# Patient Record
Sex: Male | Born: 1940 | Race: White | Hispanic: No | Marital: Married | Smoking: Current every day smoker
Health system: Southern US, Community
[De-identification: ages and names within clinical notes are randomized; demographics above are authoritative.]

## PROBLEM LIST (undated history)

## (undated) DIAGNOSIS — Z8781 Personal history of (healed) traumatic fracture: Secondary | ICD-10-CM

## (undated) DIAGNOSIS — M810 Age-related osteoporosis without current pathological fracture: Secondary | ICD-10-CM

## (undated) DIAGNOSIS — F329 Major depressive disorder, single episode, unspecified: Secondary | ICD-10-CM

## (undated) DIAGNOSIS — R1084 Generalized abdominal pain: Secondary | ICD-10-CM

## (undated) DIAGNOSIS — C3492 Malignant neoplasm of unspecified part of left bronchus or lung: Principal | ICD-10-CM

## (undated) DIAGNOSIS — E039 Hypothyroidism, unspecified: Secondary | ICD-10-CM

## (undated) DIAGNOSIS — R918 Other nonspecific abnormal finding of lung field: Secondary | ICD-10-CM

## (undated) DIAGNOSIS — K861 Other chronic pancreatitis: Secondary | ICD-10-CM

## (undated) DIAGNOSIS — M549 Dorsalgia, unspecified: Secondary | ICD-10-CM

## (undated) DIAGNOSIS — F1910 Other psychoactive substance abuse, uncomplicated: Secondary | ICD-10-CM

## (undated) DIAGNOSIS — F419 Anxiety disorder, unspecified: Secondary | ICD-10-CM

## (undated) DIAGNOSIS — F32A Depression, unspecified: Secondary | ICD-10-CM

## (undated) DIAGNOSIS — H9190 Unspecified hearing loss, unspecified ear: Secondary | ICD-10-CM

## (undated) DIAGNOSIS — G8929 Other chronic pain: Secondary | ICD-10-CM

## (undated) HISTORY — DX: Hypothyroidism, unspecified: E03.9

## (undated) HISTORY — PX: APPENDECTOMY: SHX54

## (undated) HISTORY — DX: Major depressive disorder, single episode, unspecified: F32.9

## (undated) HISTORY — DX: Anxiety disorder, unspecified: F41.9

## (undated) HISTORY — DX: Other nonspecific abnormal finding of lung field: R91.8

## (undated) HISTORY — DX: Depression, unspecified: F32.A

## (undated) HISTORY — DX: Other chronic pain: G89.29

## (undated) HISTORY — DX: Other psychoactive substance abuse, uncomplicated: F19.10

## (undated) HISTORY — DX: Age-related osteoporosis without current pathological fracture: M81.0

## (undated) HISTORY — DX: Dorsalgia, unspecified: M54.9

## (undated) HISTORY — DX: Generalized abdominal pain: R10.84

## (undated) HISTORY — DX: Other chronic pancreatitis: K86.1

## (undated) HISTORY — DX: Malignant neoplasm of unspecified part of left bronchus or lung: C34.92

## (undated) HISTORY — PX: OTHER SURGICAL HISTORY: SHX169

## (undated) HISTORY — DX: Unspecified hearing loss, unspecified ear: H91.90

## (undated) HISTORY — DX: Personal history of (healed) traumatic fracture: Z87.81

---

## 1991-03-26 HISTORY — PX: MESENTERIC ARTERY BYPASS: SHX5968

## 1991-03-26 HISTORY — PX: BOWEL RESECTION: SHX1257

## 2007-10-02 ENCOUNTER — Encounter: Admission: RE | Admit: 2007-10-02 | Discharge: 2007-10-02 | Payer: Self-pay | Admitting: Family Medicine

## 2009-03-27 ENCOUNTER — Ambulatory Visit (HOSPITAL_COMMUNITY): Admission: RE | Admit: 2009-03-27 | Discharge: 2009-03-27 | Payer: Self-pay | Admitting: Family Medicine

## 2010-04-09 ENCOUNTER — Encounter
Admission: RE | Admit: 2010-04-09 | Discharge: 2010-04-09 | Payer: Self-pay | Source: Home / Self Care | Attending: Family Medicine | Admitting: Family Medicine

## 2010-04-18 ENCOUNTER — Ambulatory Visit (HOSPITAL_COMMUNITY)
Admission: RE | Admit: 2010-04-18 | Discharge: 2010-04-18 | Payer: Self-pay | Source: Home / Self Care | Attending: Family Medicine | Admitting: Family Medicine

## 2011-05-24 HISTORY — PX: EYE SURGERY: SHX253

## 2015-12-29 ENCOUNTER — Ambulatory Visit
Admission: RE | Admit: 2015-12-29 | Discharge: 2015-12-29 | Disposition: A | Discharge status: Home / Self Care | Payer: Medicare Other | Source: Ambulatory Visit | Attending: Family Medicine | Admitting: Family Medicine

## 2015-12-29 ENCOUNTER — Other Ambulatory Visit: Payer: Self-pay | Admitting: Family Medicine

## 2015-12-29 DIAGNOSIS — M25532 Pain in left wrist: Secondary | ICD-10-CM

## 2015-12-29 DIAGNOSIS — M25512 Pain in left shoulder: Secondary | ICD-10-CM

## 2015-12-29 DIAGNOSIS — M79602 Pain in left arm: Secondary | ICD-10-CM

## 2015-12-29 DIAGNOSIS — R0989 Other specified symptoms and signs involving the circulatory and respiratory systems: Secondary | ICD-10-CM

## 2015-12-29 DIAGNOSIS — R0781 Pleurodynia: Secondary | ICD-10-CM

## 2016-01-02 ENCOUNTER — Other Ambulatory Visit: Payer: Self-pay | Admitting: Family Medicine

## 2016-01-02 DIAGNOSIS — R9389 Abnormal findings on diagnostic imaging of other specified body structures: Secondary | ICD-10-CM

## 2016-01-05 ENCOUNTER — Other Ambulatory Visit: Payer: Medicare Other

## 2016-01-09 ENCOUNTER — Other Ambulatory Visit: Payer: Medicare Other

## 2016-01-11 ENCOUNTER — Ambulatory Visit
Admission: RE | Admit: 2016-01-11 | Discharge: 2016-01-11 | Disposition: A | Discharge status: Home / Self Care | Payer: Medicare Other | Source: Ambulatory Visit | Attending: Family Medicine | Admitting: Family Medicine

## 2016-01-11 DIAGNOSIS — R9389 Abnormal findings on diagnostic imaging of other specified body structures: Secondary | ICD-10-CM

## 2016-01-11 MED ORDER — IOPAMIDOL (ISOVUE-300) INJECTION 61%
75.0000 mL | Freq: Once | INTRAVENOUS | Status: AC | PRN
  Administered 2016-01-11: 75 mL via INTRAVENOUS

## 2016-01-15 ENCOUNTER — Other Ambulatory Visit (HOSPITAL_COMMUNITY): Payer: Self-pay | Admitting: Family Medicine

## 2016-01-15 DIAGNOSIS — R918 Other nonspecific abnormal finding of lung field: Secondary | ICD-10-CM

## 2016-01-23 ENCOUNTER — Encounter (HOSPITAL_COMMUNITY)
Admission: RE | Admit: 2016-01-23 | Discharge: 2016-01-23 | Disposition: A | Discharge status: Home / Self Care | Payer: Medicare Other | Source: Ambulatory Visit | Attending: Family Medicine | Admitting: Family Medicine

## 2016-01-23 DIAGNOSIS — R918 Other nonspecific abnormal finding of lung field: Secondary | ICD-10-CM | POA: Insufficient documentation

## 2016-01-23 LAB — GLUCOSE, CAPILLARY: Glucose-Capillary: 88 mg/dL (ref 65–99)

## 2016-01-23 MED ORDER — FLUDEOXYGLUCOSE F - 18 (FDG) INJECTION
6.3000 | Freq: Once | INTRAVENOUS | Status: AC | PRN
  Administered 2016-01-23: 6.3 via INTRAVENOUS

## 2016-01-30 ENCOUNTER — Encounter: Payer: Self-pay | Admitting: *Deleted

## 2016-01-30 NOTE — Progress Notes (Signed)
Oncology Nurse Navigator Documentation  Oncology Nurse Navigator Flowsheets 01/30/2016  Referral date to RadOnc/MedOnc 01/30/2016  Treatment Phase Pre-Tx/Tx Discussion/I received referral today on Miguel Orr.  Patient needs tissue DX and to see thoracic surgery.  I notified [facility] office to schedule. I then called the referring office and notified them of next steps for patient.  I spoke with [facility] and she was thankful for the call.   Barriers/Navigation Needs Coordination of Care  Interventions Coordination of Care  Coordination of Care Appts  Acuity Level 2  Acuity Level 2 Assistance expediting appointments  Time Spent with Patient 30

## 2016-02-05 ENCOUNTER — Encounter: Payer: Self-pay | Admitting: *Deleted

## 2016-02-05 DIAGNOSIS — R918 Other nonspecific abnormal finding of lung field: Secondary | ICD-10-CM | POA: Insufficient documentation

## 2016-02-05 DIAGNOSIS — F32A Depression, unspecified: Secondary | ICD-10-CM | POA: Insufficient documentation

## 2016-02-05 DIAGNOSIS — M549 Dorsalgia, unspecified: Secondary | ICD-10-CM

## 2016-02-05 DIAGNOSIS — F329 Major depressive disorder, single episode, unspecified: Secondary | ICD-10-CM | POA: Insufficient documentation

## 2016-02-05 DIAGNOSIS — F419 Anxiety disorder, unspecified: Secondary | ICD-10-CM | POA: Insufficient documentation

## 2016-02-05 DIAGNOSIS — F3289 Other specified depressive episodes: Secondary | ICD-10-CM

## 2016-02-05 DIAGNOSIS — R1084 Generalized abdominal pain: Secondary | ICD-10-CM

## 2016-02-05 DIAGNOSIS — Z8781 Personal history of (healed) traumatic fracture: Secondary | ICD-10-CM | POA: Insufficient documentation

## 2016-02-05 DIAGNOSIS — M810 Age-related osteoporosis without current pathological fracture: Secondary | ICD-10-CM | POA: Insufficient documentation

## 2016-02-05 DIAGNOSIS — H9190 Unspecified hearing loss, unspecified ear: Secondary | ICD-10-CM | POA: Insufficient documentation

## 2016-02-05 DIAGNOSIS — G8929 Other chronic pain: Secondary | ICD-10-CM | POA: Insufficient documentation

## 2016-02-05 DIAGNOSIS — F1910 Other psychoactive substance abuse, uncomplicated: Secondary | ICD-10-CM

## 2016-02-05 DIAGNOSIS — E079 Disorder of thyroid, unspecified: Secondary | ICD-10-CM | POA: Insufficient documentation

## 2016-02-05 DIAGNOSIS — H918X3 Other specified hearing loss, bilateral: Secondary | ICD-10-CM

## 2016-02-05 DIAGNOSIS — M8080XS Other osteoporosis with current pathological fracture, unspecified site, sequela: Secondary | ICD-10-CM

## 2016-02-05 DIAGNOSIS — K861 Other chronic pancreatitis: Secondary | ICD-10-CM

## 2016-02-06 ENCOUNTER — Encounter: Payer: Self-pay | Admitting: Cardiothoracic Surgery

## 2016-02-06 ENCOUNTER — Institutional Professional Consult (permissible substitution) (INDEPENDENT_AMBULATORY_CARE_PROVIDER_SITE_OTHER): Payer: Medicare Other | Admitting: Cardiothoracic Surgery

## 2016-02-06 VITALS — BP 123/72 | HR 71 | Resp 16 | Ht 66.0 in | Wt 120.0 lb

## 2016-02-06 DIAGNOSIS — R918 Other nonspecific abnormal finding of lung field: Secondary | ICD-10-CM

## 2016-02-06 NOTE — Patient Instructions (Signed)
Lung Cancer Lung cancer occurs when abnormal cells in the lung grow out of control and form a mass (tumor). There are several types of lung cancer. The two most common types are:  Non-small cell. In this type of lung cancer, abnormal cells are larger and grow more slowly than those of small cell lung cancer.  Small cell. In this type of lung cancer, abnormal cells are smaller than those of non-small cell lung cancer. Small cell lung cancer gets worse faster than non-small cell lung cancer.  What are the causes? The leading cause of lung cancer is smoking tobacco. The second leading cause is radon exposure. What increases the risk?  Smoking tobacco.  Exposure to secondhand tobacco smoke.  Exposure to radon gas.  Exposure to asbestos.  Exposure to arsenic in drinking water.  Air pollution.  Family or personal history of lung cancer.  Lung radiation therapy.  Being older than 65 years. What are the signs or symptoms? In the early stages, symptoms may not be present. As the cancer progresses, symptoms may include:  A lasting cough, possibly with blood.  Fatigue.  Unexplained weight loss.  Shortness of breath.  Wheezing.  Chest pain.  Loss of appetite.  Symptoms of advanced lung cancer include:  Hoarseness.  Bone or joint pain.  Weakness.  Nail problems.  Face or arm swelling.  Paralysis of the face.  Drooping eyelids.  How is this diagnosed? Lung cancer can be identified with a physical exam and with tests such as:  A chest X-ray.  A CT scan.  Blood tests.  A biopsy.  After a diagnosis is made, you will have more tests to determine the stage of the cancer. The stages of non-small cell lung cancer are:  Stage 0, also called carcinoma in situ. At this stage, abnormal cells are found in the inner lining of your lung or lungs.  Stage I. At this stage, abnormal cells have grown into a tumor that is no larger than 5 cm across. The cancer has entered  the deeper lung tissue but has not yet entered the lymph nodes or other parts of the body.  Stage II. At this stage, the tumor is 7 cm across or smaller and has entered nearby lymph nodes. Or, the tumor is 5 cm across or smaller and has invaded surrounding tissue but is not found in nearby lymph nodes. There may be more than one tumor present.  Stage III. At this stage, the tumor may be any size. There may be more than one tumor in the lungs. The cancer cells have spread to the lymph nodes and possibly to other organs.  Stage IV. At this stage, there are tumors in both lungs and the cancer has spread to other areas of the body.  The stages of small cell lung cancer are:  Limited. At this stage, the cancer is found only on one side of the chest.  Extensive. At this stage, the cancer is in the lungs and in tissues on the other side of the chest. The cancer has spread to other organs or is found in the fluid between the layers of your lungs.  How is this treated? Depending on the type and stage of your lung cancer, you may be treated with:  Surgery. This is done to remove a tumor.  Radiation therapy. This treatment destroys cancer cells using X-rays or other types of radiation.  Chemotherapy. This treatment uses medicines to destroy cancer cells.  Targeted therapy. This treatment   aims to destroy only cancer cells instead of all cells as other therapies do.  You may also have a combination of treatments. Follow these instructions at home:  Do not use any tobacco products. This includes cigarettes, chewing tobacco, and electronic cigarettes. If you need help quitting, ask your health care provider.  Take medicines only as directed by your health care provider.  Eat a healthy diet. Work with a dietitian to make sure you are getting the nutrition you need.  Consider joining a support group or seeking counseling to help you cope with the stress of having lung cancer.  Let your cancer  specialist (oncologist) know if you are admitted to the hospital.  Keep all follow-up visits as directed by your health care provider. This is important. Contact a health care provider if:  You lose weight without trying.  You have a persistent cough and wheezing.  You feel short of breath.  You tire easily.  You experience bone or joint pain.  You have difficulty swallowing.  You feel hoarse or notice your voice changing.  Your pain medicine is not helping. Get help right away if:  You cough up blood.  You have new breathing problems.  You develop chest pain.  You develop swelling in: ? One or both ankles or legs. ? Your face, neck, or arms.  You are confused.  You experience paralysis in your face or a drooping eyelid. This information is not intended to replace advice given to you by your health care provider. Make sure you discuss any questions you have with your health care provider. Document Released: 06/17/2000 Document Revised: 08/17/2015 Document Reviewed: 07/15/2013 Elsevier Interactive Patient Education  2017 [employer]vier Inc[employer]  

## 2016-02-06 NOTE — Progress Notes (Signed)
[address]       [place] [address]             [phone]                    Angela Purkey [place] Medical Record #[id number] Date of Birth: 13-Oct-1940  Referring: Kathyrn Lass, MD Primary Care: [name], MD  Chief Complaint:    Chief Complaint  Patient presents with  . Lung Mass    LEFT UPPER LOBE...CT 01/11/16, PET 01/23/16    History of Present Illness:    Miguel Orr 75 y.o. male is seen in the office  today for after a CT scan and PET scan was performed Because of increasing left shoulder and arm pain. He notes it is been increasing difficult to sleep especially on his left side, now sleeping mostly in a chair. He's had no recent pneumonia or hemoptysis. The patient has noticed 5-8 pound weight loss over the past several months. He's been a long-term smoker for over 60 years. He does have significant exertional shortness of breath and underlying emphysematous lung disease, he does do some yard work but notes he will get short of breath if he works too fast.   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  Current Activity/ Functional Status:  Patient is independent with mobility/ambulation, transfers, ADL's, IADL's.   Zubrod Score: At the time of surgery this patient's most appropriate activity status/level should be described as: '[]'$     0    Normal activity, no symptoms '[]'$     1    Restricted in physical strenuous activity but ambulatory, able to do out light work '[]'$     2    Ambulatory and capable of self care, unable to do work activities, up and about               >50 % of waking hours                              '[]'$     3    Only limited self care, in bed greater than 50% of waking hours '[]'$     4    Completely disabled, no self care, confined to bed or chair '[]'$     5     Moribund   Past Medical History:  Diagnosis Date  . Anxiety   . Chronic back pain   . Chronic generalized abdominal pain   . Chronic pancreatitis ([place])   . Depression   . Hearing loss    HAS HEARING AIDES  . History of vertebral compression fracture    T8, 10, 11, 12  . 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 Mass of upper lobe of left lung    CXR 12/29/15, CT CHEST 1019/17, PET 01/23/16  . Osteoporosis   . Substance abuse    TOBACCO    Past Surgical History:  Procedure Laterality Date  . APPENDECTOMY    . BOWEL RESECTION  1993  . EYE SURGERY Bilateral 05/2011   DR. SHAPIRO  . MESENTERIC ARTERY BYPASS  1993  . RECTAL ABSCESS      Family History  Problem Relation Age of Onset  . Hyperlipidemia Mother   . Pneumonia Father   . Alzheimer's  disease Sister   . Pneumonia Brother   . Cancer Brother     BONE MARROW  . Hypertension Brother     Social History   Social History  . Marital status: Married    Spouse name: N/A  . Number of children: N/A  . Years of education: N/A   Occupational History  . Not on file.   Social History Main Topics  . Smoking status: Current Every Day Smoker    Packs/day: 1.00    Years: 60.00    Types: Cigarettes  . Smokeless tobacco: Never Used  . Alcohol use No  . Drug use: Unknown  . Sexual activity: Not on file   Other Topics Concern  . Not on file   Social History Narrative  . No narrative on file    History  Smoking Status  . Current Every Day Smoker  . Packs/day: 1.00  . Years: 60.00  . Types: Cigarettes  Smokeless Tobacco  . Never Used    History  Alcohol Use No     Allergies  Allergen Reactions  . Dilaudid [Hydromorphone Hcl] Other (See Comments)    HALLUCINATIONS    Current Outpatient Prescriptions  Medication Sig Dispense Refill  . alendronate (FOSAMAX) 70 MG tablet Take 70 mg by mouth once a week. Take with a full glass of water on an empty stomach.    . ALPRAZolam (XANAX) 0.25 MG tablet Take 0.25 mg  by mouth daily as needed for anxiety.     Marland Kitchen aspirin 325 MG EC tablet Take 325 mg by mouth at bedtime.     . Cholecalciferol (VITAMIN D3) 5000 units TBDP Take 1 capsule by mouth daily.    . Ginkgo Biloba 60 MG CAPS Take 2 capsules by mouth daily.     Marland Kitchen HYDROcodone-acetaminophen (NORCO) 10-325 MG tablet Take 1 tablet by mouth 3 (three) times daily as needed.    Marland Kitchen levothyroxine (SYNTHROID, LEVOTHROID) 200 MCG tablet Take 200 mcg by mouth daily before breakfast.    . magnesium hydroxide (PHILLIPS CHEWS) 311 MG CHEW chewable tablet Chew 311 mg by mouth at bedtime.    . Multiple Vitamins-Minerals (CENTRUM SILVER 50+MEN PO) Take 1 tablet by mouth daily.    . vitamin B-12 (CYANOCOBALAMIN) 1000 MCG tablet Take 1,000 mcg by mouth daily.     No current facility-administered medications for this visit.       Review of Systems:     Cardiac Review of Systems: Y or N  Chest Pain [left shoulder    ]  Resting SOB [  n ] Exertional SOB  [ y ]  Vertell Limber Florencio.Farrier  ]   Pedal Edema [n   ]    Palpitations Florencio.Farrier  ] Syncope  [ n ]   Presyncope [ n  ]  General Review of Systems: [Y] = yes [  ]=no Constitional: recent weight change Blue.Reese  ];  Wt loss over the last 3 months [ 5  ] anorexia [  ]; fatigue [  ]; nausea [  ]; night sweats [n  ]; fever [  n]; or chills Florencio.Farrier ];          Dental: poor dentition[  ]; Last Dentist visit:   Eye : blurred vision n[  ]; diplopia [   ]; vision changes [  ];  Amaurosis fugax[  ]; Resp: cough [ y ];  wheezing[n  ];  hemoptysis[n  ]; shortness of breath[y  ]; paroxysmal nocturnal dyspnea[ n ]; dyspnea on  exertion[ y ]; or orthopnea[  ];  GI:  gallstones[  ], vomiting[  ];  dysphagia[  ]; melena[  ];  hematochezia [  ]; heartburn[  ];   Hx of  Colonoscopy[  ]; GU: kidney stones [  ]; hematuria[ n ];   dysuria [n  ];  nocturia[  ];  history of     obstruction [  ]; urinary frequency [  ]             Skin: rash, swelling[  ];, hair loss[  ];  peripheral edema[  ];  or itching[  ]; Musculosketetal:  myalgias[  ];  joint swelling[  ];  joint erythema[  ];  joint pain[  ];  back pain[  ];  Heme/Lymph: bruising[n  ];  bleeding[  ];  anemia[  ];  Neuro: TIA[  n];  headaches[ n ];  stroke[n  ];  vertigo[  ];  seizures[  ];   paresthesias[  ];  difficulty walking[ n ];  Psych:depression[  ]; anxiety[  ];  Endocrine: diabetes[  n];  thyroid dysfunction[ y ];  Immunizations: Flu up to date [ n ]; Pneumococcal up to date Blue.Reese  ];  Other:  Physical Exam: BP 123/72 (BP Location: Right Arm, Patient Position: Sitting, Cuff Size: Normal)   Pulse 71   Resp 16   Ht '5\' 6"'$  (1.676 m)   Wt 120 lb (54.4 kg)   SpO2 97% Comment: ON RA  BMI 19.37 kg/m   PHYSICAL EXAMINATION: General appearance: alert, cooperative, appears older than stated age, cachectic and no distress Head: Normocephalic, without obvious abnormality, atraumatic Neck: no adenopathy, no carotid bruit, no JVD, supple, symmetrical, trachea midline and thyroid not enlarged, symmetric, no tenderness/mass/nodules Lymph nodes: Cervical, supraclavicular, and axillary nodes normal. Resp: diminished breath sounds bilaterally Back: symmetric, no curvature. ROM normal. No CVA tenderness. Cardio: regular rate and rhythm, S1, S2 normal, no murmur, click, rub or gallop GI: soft, non-tender; bowel sounds normal; no masses,  no organomegaly Extremities: extremities normal, atraumatic, no cyanosis or edema, Homans sign is negative, no sign of DVT and Patient has marked bilateral clubbing of the digits, he's really unaware when this was first apparent Neurologic: Grossly normal  Diagnostic Studies & Laboratory data:     Recent Radiology Findings:   Ct Chest W Contrast  Result Date: 01/11/2016 CLINICAL DATA:  Abnormal x-ray. EXAM: CT CHEST WITH CONTRAST TECHNIQUE: Multidetector CT imaging of the chest was performed during intravenous contrast administration. CONTRAST:  15m ISOVUE-300 IOPAMIDOL (ISOVUE-300) INJECTION 61% COMPARISON:  Radiographs of  December 29, 2015. CT scan of September 30, 2007. FINDINGS: Cardiovascular: Atherosclerosis of thoracic aorta is noted without aneurysm or dissection. Coronary artery calcifications are noted. Mediastinum/Nodes: 11 mm sub carinal lymph node is noted. 11 mm right hilar lymph node is noted. These are unchanged compared to prior exam and most likely reactive in etiology. Lungs/Pleura: Stable biapical scarring is noted. Emphysematous disease is noted in the upper lobes bilaterally. 4.2 x 2.1 cm spiculated pleural-based mass is noted in left upper lobe concerning for malignancy. This is new since prior exam. Upper Abdomen: No significant abnormality seen in the visualized portion of upper abdomen. Musculoskeletal: Multiple compression fractures are noted in the thoracic spine. IMPRESSION: Aortic atherosclerosis. Emphysematous disease is noted in in both upper lobes. Coronary artery calcifications are noted suggesting coronary artery disease. 4.2 x 2.1 cm spiculated pleural-based mass is noted in the left upper lobe concerning for malignancy. PET scan is recommended for  further evaluation. These results will be called to the ordering clinician or representative by the Radiologist Assistant, and communication documented in the PACS or zVision Dashboard. Electronically Signed   By: Marijo Conception, M.D.   On: 01/11/2016 15:43   Nm Pet Image Initial (pi) Skull Base To Thigh  Result Date: 01/23/2016 CLINICAL DATA:  Initial treatment strategy for left upper lobe lung mass. EXAM: NUCLEAR MEDICINE PET SKULL BASE TO THIGH TECHNIQUE: 6.3 mCi F-18 FDG was injected intravenously. Full-ring PET imaging was performed from the skull base to thigh after the radiotracer. CT data was obtained and used for attenuation correction and anatomic localization. FASTING BLOOD GLUCOSE:  Value: 88 mg/dl COMPARISON:  Chest CT on 01/11/2016 FINDINGS: NECK No hypermetabolic lymph nodes in the neck. CHEST 4.3 cm spiculated mass in the peripheral left  upper lobe which abuts the left anterior chest wall is hypermetabolic, with SUV max of 22.5. No other suspicious pulmonary nodules or masses seen on CT images. Moderate to severe centrilobular emphysema again noted. No evidence of pleural effusion. 11 mm subcarinal mediastinal lymph node is seen with SUV max of 4.3. No other hypermetabolic mediastinal or hilar lymph nodes identified. ABDOMEN/PELVIS No abnormal hypermetabolic activity within the liver, pancreas, adrenal glands, or spleen. No hypermetabolic lymph nodes in the abdomen or pelvis. Prior cholecystectomy noted. Aortic atherosclerosis. Surgical clips noted within the pelvis. SKELETON No focal hypermetabolic activity to suggest skeletal metastasis. IMPRESSION: Hypermetabolic 4.3 cm peripheral left upper lobe mass which abuts the left anterior chest wall, consistent with primary bronchogenic carcinoma. Mildly hypermetabolic 11 mm subcarinal mediastinal lymph node. Lymph node metastasis cannot be excluded. No evidence of metastatic disease within the neck, abdomen, or pelvis. Moderate to severe centrilobular emphysema. Electronically Signed   By: Earle Gell M.D.   On: 01/23/2016 10:07     I have independently reviewed the above radiologic studies.  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 He does need a  tissue diagnosis. We will arrange for a CT-guided needle biopsy of the left upper lobe lesion. Present the patient in the multidisciplinary thoracic oncology conference and having seen in the clinic by radiation and medical oncology, after pathology results have been confirmed.    I  spent 40 minutes counseling the patient face to face and 50% or more the  time was spent in counseling and coordination of care. The total time spent in the appointment was 60 minutes.  Grace Isaac MD      [address] [place] [address] Office [phone]   Beeper [phone]  02/06/2016 5:16 PM

## 2016-02-07 ENCOUNTER — Other Ambulatory Visit: Payer: Self-pay | Admitting: *Deleted

## 2016-02-07 DIAGNOSIS — R918 Other nonspecific abnormal finding of lung field: Secondary | ICD-10-CM

## 2016-02-14 ENCOUNTER — Telehealth: Payer: Self-pay | Admitting: *Deleted

## 2016-02-14 DIAGNOSIS — R918 Other nonspecific abnormal finding of lung field: Secondary | ICD-10-CM

## 2016-02-14 NOTE — Telephone Encounter (Signed)
Oncology Nurse Navigator Documentation  Oncology Nurse Navigator Flowsheets 02/14/2016  Navigator Location [facility]  Navigator Encounter Type Telephone/I received referral and noted biopsy is scheduled for 02/20/16.  I called patient and scheduled him to be seen at [facility] on 02/22/16.  Patient verbalized understanding of appt time and place.   Telephone Outgoing Call  Treatment Phase Pre-Tx/Tx Discussion  Barriers/Navigation Needs Coordination of Care  Interventions Coordination of Care  Coordination of Care Appts  Acuity Level 1  Time Spent with Patient 30

## 2016-02-19 ENCOUNTER — Other Ambulatory Visit: Payer: Self-pay | Admitting: Radiology

## 2016-02-19 ENCOUNTER — Telehealth: Payer: Self-pay | Admitting: *Deleted

## 2016-02-19 NOTE — Telephone Encounter (Signed)
Mailed [facility] letter to pt.  

## 2016-02-20 ENCOUNTER — Encounter (HOSPITAL_COMMUNITY): Payer: Self-pay

## 2016-02-20 ENCOUNTER — Ambulatory Visit (HOSPITAL_COMMUNITY)
Admission: RE | Admit: 2016-02-20 | Discharge: 2016-02-20 | Disposition: A | Discharge status: Home / Self Care | Payer: Medicare Other | Source: Ambulatory Visit | Attending: Cardiothoracic Surgery | Admitting: Cardiothoracic Surgery

## 2016-02-20 DIAGNOSIS — C3412 Malignant neoplasm of upper lobe, left bronchus or lung: Secondary | ICD-10-CM | POA: Diagnosis not present

## 2016-02-20 DIAGNOSIS — Z79899 Other long term (current) drug therapy: Secondary | ICD-10-CM | POA: Diagnosis not present

## 2016-02-20 DIAGNOSIS — R918 Other nonspecific abnormal finding of lung field: Secondary | ICD-10-CM | POA: Diagnosis present

## 2016-02-20 DIAGNOSIS — Z7982 Long term (current) use of aspirin: Secondary | ICD-10-CM | POA: Insufficient documentation

## 2016-02-20 DIAGNOSIS — F17210 Nicotine dependence, cigarettes, uncomplicated: Secondary | ICD-10-CM | POA: Insufficient documentation

## 2016-02-20 DIAGNOSIS — R0602 Shortness of breath: Secondary | ICD-10-CM | POA: Insufficient documentation

## 2016-02-20 DIAGNOSIS — J984 Other disorders of lung: Secondary | ICD-10-CM | POA: Insufficient documentation

## 2016-02-20 DIAGNOSIS — R634 Abnormal weight loss: Secondary | ICD-10-CM | POA: Insufficient documentation

## 2016-02-20 LAB — CBC
HCT: 35.9 % — ABNORMAL LOW (ref 39.0–52.0)
HEMOGLOBIN: 12.4 g/dL — AB (ref 13.0–17.0)
MCH: 32.5 pg (ref 26.0–34.0)
MCHC: 34.5 g/dL (ref 30.0–36.0)
MCV: 94.2 fL (ref 78.0–100.0)
Platelets: 201 10*3/uL (ref 150–400)
RBC: 3.81 MIL/uL — AB (ref 4.22–5.81)
RDW: 15 % (ref 11.5–15.5)
WBC: 6.8 10*3/uL (ref 4.0–10.5)

## 2016-02-20 LAB — APTT: aPTT: 30 seconds (ref 24–36)

## 2016-02-20 LAB — PROTIME-INR
INR: 1.05
PROTHROMBIN TIME: 13.7 s (ref 11.4–15.2)

## 2016-02-20 MED ORDER — FENTANYL CITRATE (PF) 100 MCG/2ML IJ SOLN
INTRAMUSCULAR | Status: AC
  Filled 2016-02-20: qty 4

## 2016-02-20 MED ORDER — MIDAZOLAM HCL 2 MG/2ML IJ SOLN
INTRAMUSCULAR | Status: AC | PRN
  Administered 2016-02-20: 0.5 mg via INTRAVENOUS
  Administered 2016-02-20: 1 mg via INTRAVENOUS
  Administered 2016-02-20 (×2): 0.5 mg via INTRAVENOUS

## 2016-02-20 MED ORDER — SODIUM CHLORIDE 0.9 % IV SOLN
INTRAVENOUS | Status: DC
  Administered 2016-02-20: 11:00:00 via INTRAVENOUS

## 2016-02-20 MED ORDER — LIDOCAINE HCL 1 % IJ SOLN
INTRAMUSCULAR | Status: AC
  Filled 2016-02-20: qty 20

## 2016-02-20 MED ORDER — MIDAZOLAM HCL 2 MG/2ML IJ SOLN
INTRAMUSCULAR | Status: AC
  Filled 2016-02-20: qty 4

## 2016-02-20 MED ORDER — FENTANYL CITRATE (PF) 100 MCG/2ML IJ SOLN
INTRAMUSCULAR | Status: AC | PRN
  Administered 2016-02-20 (×2): 25 ug via INTRAVENOUS
  Administered 2016-02-20: 50 ug via INTRAVENOUS

## 2016-02-20 NOTE — Discharge Instructions (Signed)
Needle Biopsy of Lung, Care After Refer to this sheet in the next few weeks. These instructions provide you with information on caring for yourself after your procedure. Your health care provider may also give you more specific instructions. Your treatment has been planned according to current medical practices, but problems sometimes occur. Call your health care provider if you have any problems or questions after your procedure. WHAT TO EXPECT AFTER THE PROCEDURE  A bandage will be applied over the area where the needle was inserted. You may be asked to apply pressure to the bandage for several minutes to ensure there is minimal bleeding.  In most cases, you can leave when your needle biopsy procedure is completed. Do not drive yourself home. Someone else should take you home.  If you received an IV sedative or general anesthetic, you will be taken to a comfortable place to relax while the medicine wears off.  If you have upcoming travel scheduled, talk to your health care provider about when it is safe to travel by air after the procedure. HOME CARE INSTRUCTIONS  Expect to take it easy for the rest of the day.  Protect the area where you received the needle biopsy by keeping the bandage in place for as long as instructed.  You may feel some mild pain or discomfort in the area, but this should stop in a day or two.  Take medicines only as directed by your health care provider. SEEK MEDICAL CARE IF:   You have pain at the biopsy site that worsens or is not helped by medicine.  You have swelling or drainage at the needle biopsy site.  You have a fever. SEEK IMMEDIATE MEDICAL CARE IF:   You have new or worsening shortness of breath.  You have chest pain.  You are coughing up blood.  You have bleeding that does not stop with pressure or a bandage.  You develop light-headedness or fainting. This information is not intended to replace advice given to you by your health care  provider. Make sure you discuss any questions you have with your health care provider. Document Released: 01/06/2007 Document Revised: 04/01/2014 Document Reviewed: 08/03/2012 Elsevier Interactive Patient Education  2017 [employer].

## 2016-02-20 NOTE — Procedures (Signed)
Interventional Radiology Procedure Note  Procedure:  CT guided core biopsy of LUL lung mass  Complications: None  Estimated Blood Loss: < 10 mL  Findings:  Anterior LUL mass sampled via 17 G needle.  18 G core biopsy x 2.  No complications.  Miguel Orr. Kathlene Cote, M.D Pager:  [phone]

## 2016-02-20 NOTE — Progress Notes (Signed)
Threasa Beards, RN asked this nurse to review DC instructions which was done with pt and pt's wife, written copy also given; understanding verbalized. Condition stable at time of DC home at 1420.

## 2016-02-20 NOTE — Sedation Documentation (Signed)
Attempted to call report. Nurse unavailable, will call back.

## 2016-02-20 NOTE — H&P (Signed)
Chief Complaint: Left upper lobe lung mass  Referring Physician(s): Grace Isaac  Supervising Physician: Aletta Edouard  Patient Status: [facility] - Out-pt  History of Present Illness: Miguel Orr is a 75 y.o. male who was seen by Dr. Servando Snare after a CT scan and PET scan was performed.  He had been c/o increasing left shoulder and arm pain. He notes it is been increasing difficult to sleep especially on his left side, now sleeping mostly in a chair. He's had no recent pneumonia or hemoptysis.  He has noticed 5-8 pound weight loss over the past several months.   He's been a long-term smoker for over 60 years.   He does have significant exertional shortness of breath and underlying emphysematous lung disease  He is able to do some yard work but notes he will get short of breath if he works too fast.  PET scan showed a Hypermetabolic 4.3 cm peripheral left upper lobe mass which abuts the left anterior chest wall, consistent with primary bronchogenic carcinoma.  We are asked to evaluate him for CT guided biopsy.  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  During that hospitalization he remembers he developed a left pneumothorax treated with a chest tube after a central line placement.  He feels ok today. No fever/chills. He is NPO. He does not take blood thinners.   Past Medical History:  Diagnosis Date  . Anxiety   . Chronic back pain   . Chronic generalized abdominal pain   . Chronic pancreatitis ([place])   . Depression   . Hearing loss    HAS HEARING AIDES  . History of vertebral compression fracture    T8, 10, 11, 12  . 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 Mass of upper lobe of left lung    CXR 12/29/15, CT CHEST 1019/17, PET 01/23/16  . Osteoporosis   . Substance abuse    TOBACCO    Past Surgical History:  Procedure Laterality Date  . APPENDECTOMY    . BOWEL RESECTION  1993  . EYE SURGERY  Bilateral 05/2011   DR. SHAPIRO  . MESENTERIC ARTERY BYPASS  1993  . RECTAL ABSCESS      Allergies: Dilaudid [hydromorphone hcl]  Medications: Prior to Admission medications   Medication Sig Start Date End Date Taking? Authorizing Provider  alendronate (FOSAMAX) 70 MG tablet Take 70 mg by mouth once a week. Take with a full glass of water on an empty stomach.   Yes Historical Provider, MD  ALPRAZolam Duanne Moron) 0.25 MG tablet Take 0.25 mg by mouth at bedtime as needed for anxiety.    Yes Historical Provider, MD  aspirin 325 MG EC tablet Take 325 mg by mouth at bedtime.    Yes Historical Provider, MD  Cholecalciferol (VITAMIN D3) 5000 units TBDP Take 1 capsule by mouth daily.   Yes Historical Provider, MD  Ginkgo Biloba 60 MG CAPS Take 2 capsules by mouth daily.    Yes Historical Provider, MD  HYDROcodone-acetaminophen (NORCO) 10-325 MG tablet Take 1 tablet by mouth 3 (three) times daily as needed.   Yes Historical Provider, MD  levothyroxine (SYNTHROID, LEVOTHROID) 200 MCG tablet Take 200 mcg by mouth daily before breakfast.   Yes Historical Provider, MD  magnesium hydroxide (PHILLIPS CHEWS) 311 MG CHEW chewable tablet Chew 311 mg by mouth at bedtime as needed.    Yes Historical Provider, MD  Multiple Vitamins-Minerals (CENTRUM SILVER 50+MEN PO) Take  1 tablet by mouth daily.   Yes Historical Provider, MD  vitamin B-12 (CYANOCOBALAMIN) 1000 MCG tablet Take 1,000 mcg by mouth daily.   Yes Historical Provider, MD     Family History  Problem Relation Age of Onset  . Hyperlipidemia Mother   . Pneumonia Father   . Alzheimer's disease Sister   . Pneumonia Brother   . Cancer Brother     BONE MARROW  . Hypertension Brother     Social History   Social History  . Marital status: Married    Spouse name: N/A  . Number of children: N/A  . Years of education: N/A   Social History Main Topics  . Smoking status: Current Every Day Smoker    Packs/day: 1.00    Years: 60.00    Types:  Cigarettes  . Smokeless tobacco: Never Used  . Alcohol use No  . Drug use: Unknown  . Sexual activity: Not Asked   Other Topics Concern  . None   Social History Narrative  . None    Review of Systems: A 12 point ROS discussed  Review of Systems  Constitutional: Positive for activity change, fatigue and unexpected weight change. Negative for chills and fever.  HENT: Negative.   Respiratory: Positive for cough and shortness of breath. Negative for wheezing.   Cardiovascular: Negative.   Gastrointestinal: Negative.   Genitourinary: Negative.   Musculoskeletal: Negative.   Skin: Negative.   Neurological: Negative.   Hematological: Negative.   Psychiatric/Behavioral: Negative.     Vital Signs: BP (!) 147/58   Pulse 67   Temp 98.4 F (36.9 C) (Oral)   Resp 16   Ht '5\' 6"'$  (1.676 m)   Wt 120 lb (54.4 kg)   SpO2 97%   BMI 19.37 kg/m   Physical Exam  Constitutional: He is oriented to person, place, and time.  Thin, Frail, elderly, NAD  HENT:  Head: Normocephalic and atraumatic.  Eyes: EOM are normal.  Neck: Normal range of motion.  Cardiovascular: Normal rate, regular rhythm and normal heart sounds.   Pulmonary/Chest: Effort normal and breath sounds normal.  Abdominal: Soft. He exhibits no distension. There is no tenderness.  Musculoskeletal: Normal range of motion.  Neurological: He is alert and oriented to person, place, and time.  Skin: Skin is warm and dry.  Psychiatric: He has a normal mood and affect. His behavior is normal. Judgment and thought content normal.  Vitals reviewed.   Mallampati Score:  MD Evaluation Airway: WNL Heart: WNL Abdomen: WNL Chest/ Lungs: WNL ASA  Classification: 3 Mallampati/Airway Score: One  Imaging: Nm Pet Image Initial (pi) Skull Base To Thigh  Result Date: 01/23/2016 CLINICAL DATA:  Initial treatment strategy for left upper lobe lung mass. EXAM: NUCLEAR MEDICINE PET SKULL BASE TO THIGH TECHNIQUE: 6.3 mCi F-18 FDG was  injected intravenously. Full-ring PET imaging was performed from the skull base to thigh after the radiotracer. CT data was obtained and used for attenuation correction and anatomic localization. FASTING BLOOD GLUCOSE:  Value: 88 mg/dl COMPARISON:  Chest CT on 01/11/2016 FINDINGS: NECK No hypermetabolic lymph nodes in the neck. CHEST 4.3 cm spiculated mass in the peripheral left upper lobe which abuts the left anterior chest wall is hypermetabolic, with SUV max of 22.5. No other suspicious pulmonary nodules or masses seen on CT images. Moderate to severe centrilobular emphysema again noted. No evidence of pleural effusion. 11 mm subcarinal mediastinal lymph node is seen with SUV max of 4.3. No other hypermetabolic mediastinal or hilar  lymph nodes identified. ABDOMEN/PELVIS No abnormal hypermetabolic activity within the liver, pancreas, adrenal glands, or spleen. No hypermetabolic lymph nodes in the abdomen or pelvis. Prior cholecystectomy noted. Aortic atherosclerosis. Surgical clips noted within the pelvis. SKELETON No focal hypermetabolic activity to suggest skeletal metastasis. IMPRESSION: Hypermetabolic 4.3 cm peripheral left upper lobe mass which abuts the left anterior chest wall, consistent with primary bronchogenic carcinoma. Mildly hypermetabolic 11 mm subcarinal mediastinal lymph node. Lymph node metastasis cannot be excluded. No evidence of metastatic disease within the neck, abdomen, or pelvis. Moderate to severe centrilobular emphysema. Electronically Signed   By: Earle Gell M.D.   On: 01/23/2016 10:07    Labs:  CBC:  Recent Labs  02/20/16 1011  WBC 6.8  HGB 12.4*  HCT 35.9*  PLT 201    COAGS: No results for input(s): INR, APTT in the last 8760 hours.  BMP: No results for input(s): NA, K, CL, CO2, GLUCOSE, BUN, CALCIUM, CREATININE, GFRNONAA, GFRAA in the last 8760 hours.  Invalid input(s): CMP  LIVER FUNCTION TESTS: No results for input(s): BILITOT, AST, ALT, ALKPHOS, PROT,  ALBUMIN in the last 8760 hours.  TUMOR MARKERS: No results for input(s): AFPTM, CEA, CA199, CHROMGRNA in the last 8760 hours.  Assessment and Plan: Hypermetabolic 4.3 cm peripheral left upper lobe mass which abuts the left anterior chest wall, consistent with primary bronchogenic carcinoma.  Will proceed with image guided lung biopsy today by Dr. Kathlene Cote.  Risks and Benefits discussed with the patient including, but not limited to bleeding, hemoptysis, respiratory failure requiring intubation, infection, pneumothorax requiring chest tube placement, stroke from air embolism or even death.  All of the patient's questions were answered, patient is agreeable to proceed. Consent signed and in chart.  Thank you for this interesting consult.  I greatly enjoyed meeting Miguel Orr and look forward to participating in their care.  A copy of this report was sent to the requesting provider on this date.  Electronically Signed: Murrell Redden PA-C 02/20/2016, 10:37 AM   I spent a total of  30 Minutes  in face to face in clinical consultation, greater than 50% of which was counseling/coordinating care for Lung Biopsy.

## 2016-02-22 ENCOUNTER — Other Ambulatory Visit (HOSPITAL_BASED_OUTPATIENT_CLINIC_OR_DEPARTMENT_OTHER): Payer: Medicare Other

## 2016-02-22 ENCOUNTER — Encounter: Payer: Self-pay | Admitting: Internal Medicine

## 2016-02-22 ENCOUNTER — Ambulatory Visit (HOSPITAL_BASED_OUTPATIENT_CLINIC_OR_DEPARTMENT_OTHER): Payer: Medicare Other | Admitting: Internal Medicine

## 2016-02-22 ENCOUNTER — Ambulatory Visit
Admission: RE | Admit: 2016-02-22 | Discharge: 2016-02-22 | Disposition: A | Discharge status: Home / Self Care | Payer: Medicare Other | Source: Ambulatory Visit | Attending: Radiation Oncology | Admitting: Radiation Oncology

## 2016-02-22 ENCOUNTER — Encounter: Payer: Self-pay | Admitting: *Deleted

## 2016-02-22 ENCOUNTER — Ambulatory Visit: Payer: Medicare Other | Attending: Internal Medicine | Admitting: Physical Therapy

## 2016-02-22 DIAGNOSIS — C3492 Malignant neoplasm of unspecified part of left bronchus or lung: Secondary | ICD-10-CM

## 2016-02-22 DIAGNOSIS — R293 Abnormal posture: Secondary | ICD-10-CM

## 2016-02-22 DIAGNOSIS — R918 Other nonspecific abnormal finding of lung field: Secondary | ICD-10-CM

## 2016-02-22 DIAGNOSIS — M25512 Pain in left shoulder: Secondary | ICD-10-CM

## 2016-02-22 DIAGNOSIS — M546 Pain in thoracic spine: Secondary | ICD-10-CM | POA: Diagnosis present

## 2016-02-22 DIAGNOSIS — M6281 Muscle weakness (generalized): Secondary | ICD-10-CM | POA: Diagnosis present

## 2016-02-22 DIAGNOSIS — Z5111 Encounter for antineoplastic chemotherapy: Secondary | ICD-10-CM

## 2016-02-22 DIAGNOSIS — C3412 Malignant neoplasm of upper lobe, left bronchus or lung: Secondary | ICD-10-CM

## 2016-02-22 DIAGNOSIS — R2681 Unsteadiness on feet: Secondary | ICD-10-CM | POA: Diagnosis present

## 2016-02-22 LAB — CBC WITH DIFFERENTIAL/PLATELET
BASO%: 0.3 % (ref 0.0–2.0)
Basophils Absolute: 0 10*3/uL (ref 0.0–0.1)
EOS%: 1.7 % (ref 0.0–7.0)
Eosinophils Absolute: 0.1 10*3/uL (ref 0.0–0.5)
HCT: 37.1 % — ABNORMAL LOW (ref 38.4–49.9)
HGB: 12.4 g/dL — ABNORMAL LOW (ref 13.0–17.1)
LYMPH#: 1.7 10*3/uL (ref 0.9–3.3)
LYMPH%: 27.3 % (ref 14.0–49.0)
MCH: 31.7 pg (ref 27.2–33.4)
MCHC: 33.4 g/dL (ref 32.0–36.0)
MCV: 94.9 fL (ref 79.3–98.0)
MONO#: 0.5 10*3/uL (ref 0.1–0.9)
MONO%: 7.2 % (ref 0.0–14.0)
NEUT%: 63.5 % (ref 39.0–75.0)
NEUTROS ABS: 4.1 10*3/uL (ref 1.5–6.5)
PLATELETS: 132 10*3/uL — AB (ref 140–400)
RBC: 3.91 10*6/uL — AB (ref 4.20–5.82)
RDW: 14.7 % — ABNORMAL HIGH (ref 11.0–14.6)
WBC: 6.4 10*3/uL (ref 4.0–10.3)

## 2016-02-22 LAB — COMPREHENSIVE METABOLIC PANEL
ALT: 12 U/L (ref 0–55)
AST: 21 U/L (ref 5–34)
Albumin: 3.4 g/dL — ABNORMAL LOW (ref 3.5–5.0)
Alkaline Phosphatase: 56 U/L (ref 40–150)
Anion Gap: 8 mEq/L (ref 3–11)
BILIRUBIN TOTAL: 0.38 mg/dL (ref 0.20–1.20)
BUN: 14.1 mg/dL (ref 7.0–26.0)
CHLORIDE: 109 meq/L (ref 98–109)
CO2: 23 meq/L (ref 22–29)
CREATININE: 0.9 mg/dL (ref 0.7–1.3)
Calcium: 9.3 mg/dL (ref 8.4–10.4)
EGFR: 85 mL/min/{1.73_m2} — ABNORMAL LOW (ref >90)
GLUCOSE: 94 mg/dL (ref 70–140)
Potassium: 3.8 mEq/L (ref 3.5–5.1)
Sodium: 139 mEq/L (ref 136–145)
TOTAL PROTEIN: 7.2 g/dL (ref 6.4–8.3)

## 2016-02-22 NOTE — Progress Notes (Signed)
[facility] Clinical Social Work  Clinical Social Work met with patient/family at [employer] appointment to offer support and assess for psychosocial needs.  Mr. Eads was accompanied by his spouse for today's visit.  The patient was seen by radiation oncologist and medical oncologist prior to meet with CSW.  The patient shared he is unsure whether he wants to proceed with chemotherapy because he views this treatment as "poison that can damage his body".  Mr. Rekowski discussed the power of "positive thinking" and how he has chosen not to name his cancer, as he feels if he does not claim it, he can cure it.  CSW explored this concept with patient and spouse, discussed how "positive thinking" can impact emotional responses to diagnosis/treatment.  Clinical Social Work briefly discussed Clinical Social Work role and [employer] support programs/services.  Clinical Social Work encouraged patient to call with any additional questions or concerns.   Polo Riley, MSW, LCSW, OSW-C Clinical Social Worker [facility] [phone]

## 2016-02-22 NOTE — Progress Notes (Signed)
Radiation Oncology         [phone] ________________________________  Initial Outpatient Consultation  Name: Miguel Orr MRN: [id number]  Date: 02/22/2016  DOB: Oct 08, 1940  LT:[id number] Jeani Hawking, MD  Grace Isaac, MD   REFERRING PHYSICIAN: Grace Isaac, MD  DIAGNOSIS:Clinical stage IIIA (c T3, cN2, cM0) vs Stage IIB (cT3, cNo, cM0) lung cancer  HISTORY OF PRESENT ILLNESS::Miguel Orr is a 75 y.o. male who has a history of left shoulder and arm pain that he describes as an 8/10. He notes this interrupts his sleep and he now sleeps in a chair. Patient has undergone a chest CT scan as well as PET scan and CT-guided biopsy of the left upper lung lesion with pathology consistent with squamous cell carcinoma. Patient now presents to the multidisciplinary thoracic clinic for evaluation    PREVIOUS RADIATION THERAPY: No  PAST MEDICAL HISTORY:  has a past medical history of Anxiety; Chronic back pain; Chronic generalized abdominal pain; Chronic pancreatitis ([place]); Depression; Hearing loss; History of vertebral compression fracture; Hypothyroidism; Mass of upper lobe of left lung; Osteoporosis; and Substance abuse.    PAST SURGICAL HISTORY: Past Surgical History:  Procedure Laterality Date  . APPENDECTOMY    . BOWEL RESECTION  1993  . EYE SURGERY Bilateral 05/2011   DR. SHAPIRO  . MESENTERIC ARTERY BYPASS  1993  . RECTAL ABSCESS      FAMILY HISTORY: family history includes Alzheimer's disease in his sister; Cancer in his brother; Hyperlipidemia in his mother; Hypertension in his brother; Pneumonia in his brother and father.  SOCIAL HISTORY:  reports that he has been smoking Cigarettes.  He has a 60.00 pack-year smoking history. He has never used smokeless tobacco. He reports that he does not drink alcohol.  ALLERGIES: Dilaudid [hydromorphone hcl]  MEDICATIONS:  Current Outpatient Prescriptions  Medication Sig Dispense Refill  . alendronate (FOSAMAX) 70 MG tablet  Take 70 mg by mouth once a week. Take with a full glass of water on an empty stomach.    . ALPRAZolam (XANAX) 0.25 MG tablet Take 0.25 mg by mouth at bedtime as needed for anxiety.     Marland Kitchen aspirin 325 MG EC tablet Take 325 mg by mouth at bedtime.     . Cholecalciferol (VITAMIN D3) 5000 units TBDP Take 1 capsule by mouth daily.    . Ginkgo Biloba 60 MG CAPS Take 2 capsules by mouth daily.     Marland Kitchen HYDROcodone-acetaminophen (NORCO) 10-325 MG tablet Take 1 tablet by mouth 3 (three) times daily as needed.    Marland Kitchen levothyroxine (SYNTHROID, LEVOTHROID) 200 MCG tablet Take 200 mcg by mouth daily before breakfast.    . magnesium hydroxide (PHILLIPS CHEWS) 311 MG CHEW chewable tablet Chew 311 mg by mouth at bedtime as needed.     . Multiple Vitamins-Minerals (CENTRUM SILVER 50+MEN PO) Take 1 tablet by mouth daily.    . vitamin B-12 (CYANOCOBALAMIN) 1000 MCG tablet Take 1,000 mcg by mouth daily.     No current facility-administered medications for this encounter.     REVIEW OF SYSTEMS:  A 15 point review of systems is documented in the electronic medical record. This was obtained by the nursing staff. However, I reviewed this with the patient to discuss relevant findings and make appropriate changes. He also has chronic low back pain but his pain intensity is most significant in the left  chest region.    PHYSICAL EXAM: Vitals with BMI 02/22/2016  Height 5' 6"   Weight 113 lbs 10 oz  BMI 31.5  Systolic 176  Diastolic 52  Pulse 64  Respirations 16  General: Alert and oriented, in no acute distress HEENT: Head is normocephalic. Extraocular movements are intact. Oropharynx is clear. Neck: Neck is supple, no palpable cervical or supraclavicular lymphadenopathy. Heart: Regular in rate and rhythm with no murmurs, rubs, or gallops. Chest: Clear to auscultation bilaterally, with no rhonchi, wheezes, or rales. Tender with palpation along the left anterior lateral chest wall region no palpable mass in this  area Abdomen: Soft, nontender, nondistended, with no rigidity or guarding. Extremities: No cyanosis or edema. Lymphatics: see Neck Exam Skin: No concerning lesions. Musculoskeletal: symmetric strength and muscle tone throughout. Neurologic: Cranial nerves II through XII are grossly intact. No obvious focalities. Speech is fluent. Coordination is intact. Psychiatric: Judgment and insight are intact. Affect is appropriate.    ECOG = 1   LABORATORY DATA:  Lab Results  Component Value Date   WBC 6.4 02/22/2016   HGB 12.4 (L) 02/22/2016   HCT 37.1 (L) 02/22/2016   MCV 94.9 02/22/2016   PLT 132 (L) 02/22/2016   NEUTROABS 4.1 02/22/2016   Lab Results  Component Value Date   NA 139 02/22/2016   K 3.8 02/22/2016   CO2 23 02/22/2016   GLUCOSE 94 02/22/2016   CREATININE 0.9 02/22/2016   CALCIUM 9.3 02/22/2016      RADIOGRAPHY: Ct Biopsy  Result Date: 02/20/2016 CLINICAL DATA:  Smoker with 4 cm hypermetabolic left upper lobe lung mass. EXAM: CT GUIDED CORE BIOPSY OF LEFT UPPER LOBE LUNG MASS ANESTHESIA/SEDATION: 2.5 mg IV Versed; 100 mcg IV Fentanyl Total Moderate Sedation Time:  20 minutes. The patient's level of consciousness and physiologic status were continuously monitored during the procedure by Radiology nursing. PROCEDURE: The procedure risks, benefits, and alternatives were explained to the patient. Questions regarding the procedure were encouraged and answered. The patient understands and consents to the procedure. The left anterior chest wall was prepped with chlorhexidine in a sterile fashion, and a sterile drape was applied covering the operative field. A sterile gown and sterile gloves were used for the procedure. Local anesthesia was provided with 1% Lidocaine. CT was performed in a supine position. Under CT guidance, a 17 gauge needle was advanced into the anterior left upper lobe. After confirming needle tip position, 2 separate coaxial 18 gauge core biopsy samples were  obtained. Tissue was submitted in formalin. Additional CT images were performed after needle removal. COMPLICATIONS: None FINDINGS: Subpleural anterior left upper lobe mass was localized. Solid tissue was obtained with core biopsy. Post biopsy imaging shows no evidence of hemorrhage or pneumothorax. IMPRESSION: CT-guided core biopsy performed of anterior left upper lobe lung mass. Electronically Signed   By: Aletta Edouard M.D.   On: 02/20/2016 15:12      IMPRESSION: Clinicial stage IIIA (c T3, cN2, cM0) vs Stage IIB (cT3,cNo,cM0) Lung cancer , likely chest wall involvement . Patient has been evaluated by thoracic surgery is not felt to be a good surgical candidate. His PET scan shows some activity in the subcarinal region consistent with stage III disease. Patient would be a good candidate for a definitive course of radiation along with radiosensitizing chemotherapy. The patient has met with Dr. Julien Nordmann and the patient is unsure whether he would like to proceed with radiosensitizing chemotherapy but will make his decision by early next week at the time of his simulation. I discussed the course of treatment side effects and potential toxicities of radiation therapy in this situation with the patient  and his wife. Patient appears to understand and wishes to proceed with planned course of treatment.  PLAN: Simulation and treatment planning will occur on Monday 12/4. Treatments to start the week of December 11 likely with radiosensitizing chemotherapy. Plan for 6 weeks of radiation therapy with 30 treatments.  ------------------------------------------------  Blair Promise, PhD, MD    This document serves as a record of services personally performed by Gery Pray, MD. It was created on his behalf by Bethann Humble, a trained medical scribe. The creation of this record is based on the scribe's personal observations and the provider's statements to them. This document has been checked and approved by the  attending provider.

## 2016-02-22 NOTE — Therapy (Signed)
[place], [place], [address] Phone: [phone]   Fax:  [phone]  Physical Therapy Evaluation  Patient Details  Name: Miguel Orr MRN: [id number] Date of Birth: 1941/01/23 Referring Provider: Dr. Curt Bears  Encounter Date: 02/22/2016      PT End of Session - 02/22/16 1529    Visit Number 1   Number of Visits 1   PT Start Time 8144   PT Stop Time 1445   PT Time Calculation (min) 25 min   Activity Tolerance Patient tolerated treatment well   Behavior During Therapy [facility] for tasks assessed/performed      Past Medical History:  Diagnosis Date  . Anxiety   . Chronic back pain   . Chronic generalized abdominal pain   . Chronic pancreatitis ([place])   . Depression   . Hearing loss    HAS HEARING AIDES  . History of vertebral compression fracture    T8, 10, 11, 12  . 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 Mass of upper lobe of left lung    CXR 12/29/15, CT CHEST 1019/17, PET 01/23/16  . Osteoporosis   . Substance abuse    TOBACCO    Past Surgical History:  Procedure Laterality Date  . APPENDECTOMY    . BOWEL RESECTION  1993  . EYE SURGERY Bilateral 05/2011   DR. SHAPIRO  . MESENTERIC ARTERY BYPASS  1993  . RECTAL ABSCESS      There were no vitals filed for this visit.       Subjective Assessment - 02/22/16 1506    Subjective Has occasional vertigo, back pain, and left shoulder pain   Patient is accompained by: Family member  wife   Pertinent History Pt. presented with c/o left shoulder pain and weight loss; workup showedleft lung mass.  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  Current smoker who is not a good candidate for surgical resection; will probably have XRT (SBRT) and be followed.  He has a h/o T10-12 compression fractures and some ongoing pain from that.  COPD.   Patient Stated Goals get info from all lung clinic  providers   Currently in Pain? 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 Curt Bears   Onset Date/Surgical Date 12/29/15   Prior Therapy none     Precautions   Precautions Other (comment)   Precaution Comments cancer precautions, bony metastases to left scapula     Restrictions   Weight Bearing Restrictions No     Balance Screen   Has the patient fallen in the past 6 months No   Has the patient had a decrease in activity level because of a fear of falling?  No  Is the patient reluctant to leave their home because of a fear of falling?  No     Home Environment   Living Environment Private residence   Living Arrangements Spouse/significant other   Type of [place] One level;Laundry or work area in basement     Prior Function   Level of Independence Independent   Leisure no regular exercise     Cognition   Overall Cognitive Status Within Functional Limits for tasks assessed     Observation/Other Assessments   Observations very thin, frail, pale gentleman accompanied by his wife     Functional Tests   Functional tests Sit to Stand     Sit to Stand   Comments 6 times in 30 seconds, well below average for his age  moves slowly and appears challenged; tired legs after     Posture/Postural Control   Posture/Postural Control Postural limitations   Postural Limitations Increased thoracic  kyphosis;Rounded Shoulders;Forward head  signficant thoracic kyphosis     ROM / Strength   AROM / PROM / Strength AROM     AROM   Overall AROM Comments in standing, trunk AROM:  flexion--reaches fingertips 12 inches to floor; extension 75% loss; sidebend 25% loss bilat. and rotation 15% loss bilat.     Ambulation/Gait   Ambulation/Gait Yes   Ambulation/Gait Assistance 7: Independent   Gait Comments gait assessed just for 10 feet or so looks fairly normal     Dynamic Standing Balance   Dynamic Standing - Comments reaches forward 6 inches in standing, below normal for age                           PT Education - 02/22/16 1529    Education provided Yes   Education Details energy conservation, walking, Cure article on staying active, breathing, posture, PT info   Person(s) Educated Patient;Spouse   Methods Explanation;Handout   Comprehension Verbalized understanding               Lung Clinic Goals - 02/22/16 1536      Patient will be able to verbalize understanding of the benefit of exercise to decrease fatigue.   Status Achieved     Patient will be able to verbalize the importance of posture.   Status Achieved     Patient will be able to demonstrate diaphragmatic breathing for improved lung function.   Status Achieved     Patient will be able to verbalize understanding of the role of physical therapy to prevent functional decline and who to contact if physical therapy is needed.   Status Achieved             Plan - 02/22/16 1530    Clinical Impression Statement Case discussed at multidisciplinary conference prior to evaluation.  Patient with left lung mass, squamous cell carcinoma, probably metastatic to left scapula; expected to have XRT.  He is frail with limited trunk ROM, impaired balance, pain, and very poor posture with significant thoracic kyphosis.  He was limited in forward reach and 30 second sit to stand. Eval is moderate due to  evolving nature of his cancer and expected treatment; he has comorbities including COPD, h/o thoracic compression fratures, and is a smoker.   Rehab Potential Fair   Clinical Impairments Affecting Rehab Potential probable bony metastases to left scapula   PT Frequency One time visit   PT Treatment/Interventions Patient/family education   PT Next Visit Plan  None at this time; patient may benefit from therapy to improve strength and mobility.   PT Home Exercise Plan walking, breathing exercises   Consulted and Agree with Plan of Care Patient      Patient will benefit from skilled therapeutic intervention in order to improve the following deficits and impairments:  Postural dysfunction, Pain, Decreased mobility, Decreased balance, Decreased strength  Visit Diagnosis: Abnormal posture - Plan: PT plan of care cert/re-cert  Acute pain of left shoulder - Plan: PT plan of care cert/re-cert  Pain in thoracic spine - Plan: PT plan of care cert/re-cert  Unsteadiness on feet - Plan: PT plan of care cert/re-cert  Muscle weakness (generalized) - Plan: PT plan of care cert/re-cert      G-Codes - 39/58/44 1537    Functional Assessment Tool Used clinical judgement   Functional Limitation Changing and maintaining body position   Changing and Maintaining Body Position Current Status (B7127) At least 20 percent but less than 40 percent impaired, limited or restricted   Changing and Maintaining Body Position Goal Status (K7183) At least 20 percent but less than 40 percent impaired, limited or restricted   Changing and Maintaining Body Position Discharge Status (O7255) At least 20 percent but less than 40 percent impaired, limited or restricted       Problem List Patient Active Problem List   Diagnosis Date Noted  . Osteoporosis   . Thyroid disease   . Chronic pancreatitis ([place])   . Chronic back pain   . Chronic generalized abdominal pain   . Hearing loss   . Anxiety   . Depression   .  History of vertebral compression fracture   . Substance abuse   . Mass of upper lobe of left lung     Miguel Orr 02/22/2016, 3:40 PM  [place], [place], [address] Phone: [phone]   Fax:  [phone]  Name: Miguel Orr MRN: [id number] Date of Birth: 01/30/1941 Serafina Royals, PT 02/22/16 3:40 PM

## 2016-02-23 ENCOUNTER — Telehealth: Payer: Self-pay | Admitting: *Deleted

## 2016-02-23 ENCOUNTER — Encounter: Payer: Self-pay | Admitting: *Deleted

## 2016-02-23 DIAGNOSIS — R918 Other nonspecific abnormal finding of lung field: Secondary | ICD-10-CM

## 2016-02-23 NOTE — Progress Notes (Signed)
Oncology Nurse Navigator Documentation  Oncology Nurse Navigator Flowsheets 02/23/2016  Navigator Location [facility]  Navigator Encounter Type Clinic/MDC/I spoke with patient and wife yesterday at [facility] [place].  I gave and explained information on lung cancer, support services and resources at [facility], fall risk information, and next steps.  Today, I contacted authorization coordinator to help expedite pre cert for MRI Brain.    Abnormal Finding Date 12/29/2015  Confirmed Diagnosis Date 02/20/2016  Multidisiplinary Clinic Date 02/22/2016  Treatment Initiated Date 02/26/2016  Patient Visit Type MedOnc  Treatment Phase Pre-Tx/Tx Discussion  Barriers/Navigation Needs Coordination of Care;Education  Education Newly Diagnosed Cancer Education  Interventions Coordination of Care;Education  Coordination of Care Appts;Other  Education Method Verbal;Written  Acuity Level 2  Acuity Level 2 Assistance expediting appointments;Other  Time Spent with Patient 45

## 2016-02-23 NOTE — Telephone Encounter (Signed)
Oncology Nurse Navigator Documentation  Oncology Nurse Navigator Flowsheets 02/23/2016  Navigator Location [facility]  Navigator Encounter Type Telephone/I called central scheduling to get patient's MRI scheduled.  I then called Mr. [place] with his appt.  He stated he did not want scan nor treatment.  I listened as he explained.  I will notify SIM that patient wants to cancel.  I will also update Dr. Julien Nordmann, Dr. Sondra Come, and Dr. Servando Snare on patient's wishes.   Telephone Outgoing Call  Treatment Phase Pre-Tx/Tx Discussion  Barriers/Navigation Needs Coordination of Care  Interventions Coordination of Care  Coordination of Care Appts  Acuity Level 2  Acuity Level 2 Assistance expediting appointments  Time Spent with Patient 15

## 2016-02-24 ENCOUNTER — Encounter: Payer: Self-pay | Admitting: Internal Medicine

## 2016-02-24 DIAGNOSIS — C3492 Malignant neoplasm of unspecified part of left bronchus or lung: Secondary | ICD-10-CM

## 2016-02-24 DIAGNOSIS — Z5111 Encounter for antineoplastic chemotherapy: Secondary | ICD-10-CM | POA: Insufficient documentation

## 2016-02-24 HISTORY — DX: Malignant neoplasm of unspecified part of left bronchus or lung: C34.92

## 2016-02-24 NOTE — Progress Notes (Signed)
[place] [phone]   [phone] Multidisciplinary thoracic oncology clinic  CONSULT NOTE  REFERRING PHYSICIAN: Dr. Lanelle Bal  REASON FOR CONSULTATION:  75 years old white male recently diagnosed with lung cancer.  HPI Miguel Orr is a 75 y.o. male was past medical history significant for chronic pancreatitis, hypothyroidism, chronic back pain, hearing loss, anxiety/depression and long history of smoking. The patient was working in his yard when he felt pain on the left side of the chest pain so that he pulled a muscle. He will daily until he saw his primary care physician for annual physical exam and complained of the increasing left shoulder and arm pain. He also had weight loss. He had chest x-ray performed on 12/29/2015 and it showed ill-defined left upper lobe infiltrate versus mass lesion. This was followed by CT scan of the chest with contrast on 01/11/2016 and it showed a 4.2 x 2.1 cm spiculated pleural-based mass noted in the left upper lobe concerning for malignancy. This was new since the prior exam. A PET scan was performed on 01/23/2016 and it showed hypermetabolic 4.3 cm peripheral left upper lobe mass which abuts the left anterior chest wall consistent with primary bronchogenic carcinoma. There was also mildly hypermetabolic 1.1 cm subcarinal mediastinal lymph node and lymph node metastasis could not be excluded. There was no evidence of metastatic disease within the neck, abdomen or pelvis. The patient was referred to Dr. Servando Snare and he ordered CT guided core biopsy of the left upper lobe lung mass which was performed on 02/20/2016. The final pathology ([id number]) showed squamous cell carcinoma. The patient is not a good candidate for surgical resection. Dr. Servando Snare kindly referred the patient to the multidisciplinary thoracic oncology clinic today for evaluation and recommendation regarding treatment of his condition. When seen  today the patient continues to have pain on the left side of the chest and left arm. He has shortness breath with exertion but no significant cough or hemoptysis. He denied having any recent weight loss or night sweats. He has no nausea, vomiting, diarrhea or constipation. He has no headache or visual changes. Family history significant for mother with dyslipidemia, father had pneumonia, maternal grandmother with cancer and maternal grandfather with stomach cancer. He also has a brother with leukemia. The patient is married and has 3 children. He was accompanied today by his wife Carolle. 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 He has a history of heavy smoking but no history of alcohol or drug abuse.  HPI  Past Medical History:  Diagnosis Date  . Anxiety   . Chronic back pain   . Chronic generalized abdominal pain   . Chronic pancreatitis ([place])   . Depression   . Hearing loss    HAS HEARING AIDES  . History of vertebral compression fracture    T8, 10, 11, 12  . 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 Mass of upper lobe of left lung    CXR 12/29/15, CT CHEST 1019/17, PET 01/23/16  . Osteoporosis   . Substance abuse    TOBACCO    Past Surgical History:  Procedure Laterality Date  . APPENDECTOMY    . BOWEL RESECTION  1993  . EYE SURGERY Bilateral 05/2011   DR. SHAPIRO  . MESENTERIC ARTERY BYPASS  1993  . RECTAL ABSCESS      Family History  Problem Relation Age of Onset  . Hyperlipidemia Mother   . Pneumonia Father   . Alzheimer's disease  Sister   . Pneumonia Brother   . Cancer Brother     BONE MARROW  . Hypertension Brother     Social History Social History  Substance Use Topics  . Smoking status: Current Every Day Smoker    Packs/day: 1.00    Years: 60.00    Types: Cigarettes  . Smokeless tobacco: Never Used  . Alcohol use No    Allergies  Allergen Reactions  . Dilaudid [Hydromorphone Hcl] Other (See Comments)    HALLUCINATIONS    Current Outpatient Prescriptions    Medication Sig Dispense Refill  . alendronate (FOSAMAX) 70 MG tablet Take 70 mg by mouth once a week. Take with a full glass of water on an empty stomach.    . ALPRAZolam (XANAX) 0.25 MG tablet Take 0.25 mg by mouth at bedtime as needed for anxiety.     Marland Kitchen aspirin 325 MG EC tablet Take 325 mg by mouth at bedtime.     . Cholecalciferol (VITAMIN D3) 5000 units TBDP Take 1 capsule by mouth daily.    . Ginkgo Biloba 60 MG CAPS Take 2 capsules by mouth daily.     Marland Kitchen HYDROcodone-acetaminophen (NORCO) 10-325 MG tablet Take 1 tablet by mouth 3 (three) times daily as needed.    Marland Kitchen levothyroxine (SYNTHROID, LEVOTHROID) 200 MCG tablet Take 200 mcg by mouth daily before breakfast.    . magnesium hydroxide (PHILLIPS CHEWS) 311 MG CHEW chewable tablet Chew 311 mg by mouth at bedtime as needed.     . Multiple Vitamins-Minerals (CENTRUM SILVER 50+MEN PO) Take 1 tablet by mouth daily.    . vitamin B-12 (CYANOCOBALAMIN) 1000 MCG tablet Take 1,000 mcg by mouth daily.     No current facility-administered medications for this visit.     Review of Systems  Constitutional: positive for fatigue Eyes: negative for irritation, redness and visual disturbance Ears, nose, mouth, throat, and face: negative for epistaxis, hoarseness, nasal congestion, sore mouth and sore throat Respiratory: positive for cough and dyspnea on exertion Cardiovascular: negative Gastrointestinal: negative Genitourinary:negative Integument/breast: negative Hematologic/lymphatic: negative for bleeding and easy bruising Musculoskeletal:positive for muscle weakness Neurological: negative Behavioral/Psych: negative Endocrine: negative Allergic/Immunologic: negative  Physical Exam  ZOX:WRUEA, healthy, no distress, well developed, anxious and malnourished SKIN: skin color, texture, turgor are normal, no rashes or significant lesions HEAD: Normocephalic, No masses, lesions, tenderness or abnormalities EYES: normal, PERRLA, Conjunctiva are  pink and non-injected EARS: External ears normal, Canals clear OROPHARYNX:no exudate, no erythema and lips, buccal mucosa, and tongue normal  NECK: supple, no adenopathy, no JVD LYMPH:  no palpable lymphadenopathy, no hepatosplenomegaly LUNGS: clear to auscultation , and palpation HEART: regular rate & rhythm and no murmurs ABDOMEN:abdomen soft, non-tender, normal bowel sounds and no masses or organomegaly BACK: Back symmetric, no curvature., No CVA tenderness EXTREMITIES:no joint deformities, effusion, or inflammation, no edema, no skin discoloration  NEURO: alert & oriented x 3 with fluent speech, no focal motor/sensory deficits  PERFORMANCE STATUS: ECOG 1  LABORATORY DATA: Lab Results  Component Value Date   WBC 6.4 02/22/2016   HGB 12.4 (L) 02/22/2016   HCT 37.1 (L) 02/22/2016   MCV 94.9 02/22/2016   PLT 132 (L) 02/22/2016      Chemistry      Component Value Date/Time   NA 139 02/22/2016 1335   K 3.8 02/22/2016 1335   CO2 23 02/22/2016 1335   BUN 14.1 02/22/2016 1335   CREATININE 0.9 02/22/2016 1335      Component Value Date/Time   CALCIUM 9.3  02/22/2016 1335   ALKPHOS 56 02/22/2016 1335   AST 21 02/22/2016 1335   ALT 12 02/22/2016 1335   BILITOT 0.38 02/22/2016 1335       RADIOGRAPHIC STUDIES: Ct Biopsy  Result Date: 2016-03-02 CLINICAL DATA:  Smoker with 4 cm hypermetabolic left upper lobe lung mass. EXAM: CT GUIDED CORE BIOPSY OF LEFT UPPER LOBE LUNG MASS ANESTHESIA/SEDATION: 2.5 mg IV Versed; 100 mcg IV Fentanyl Total Moderate Sedation Time:  20 minutes. The patient's level of consciousness and physiologic status were continuously monitored during the procedure by Radiology nursing. PROCEDURE: The procedure risks, benefits, and alternatives were explained to the patient. Questions regarding the procedure were encouraged and answered. The patient understands and consents to the procedure. The left anterior chest wall was prepped with chlorhexidine in a sterile  fashion, and a sterile drape was applied covering the operative field. A sterile gown and sterile gloves were used for the procedure. Local anesthesia was provided with 1% Lidocaine. CT was performed in a supine position. Under CT guidance, a 17 gauge needle was advanced into the anterior left upper lobe. After confirming needle tip position, 2 separate coaxial 18 gauge core biopsy samples were obtained. Tissue was submitted in formalin. Additional CT images were performed after needle removal. COMPLICATIONS: None FINDINGS: Subpleural anterior left upper lobe mass was localized. Solid tissue was obtained with core biopsy. Post biopsy imaging shows no evidence of hemorrhage or pneumothorax. IMPRESSION: CT-guided core biopsy performed of anterior left upper lobe lung mass. Electronically Signed   By: Aletta Edouard M.D.   On: Mar 02, 2016 15:12    ASSESSMENT: This is a very pleasant 75 years old white male with unresectable a stage IIIa (T2a, N2, M0) non-small cell lung cancer, squamous cell carcinoma presented with pleural based left upper lobe lung mass in addition to suspicious subcarinal lymphadenopathy diagnosed in November 2017.   PLAN: I had a lengthy discussion with the patient and his wife today about his current disease stage, prognosis and treatment options. I recommended for the patient to complete the staging workup by ordering a MRI of the brain to rule out brain metastasis. I also discussed with the patient his treatment options including a course of concurrent chemoradiation with weekly carboplatin for AUC of 2 and paclitaxel 45 MG/M2 for a total of 6-7 weeks. I recommended for the patient a course of concurrent chemoradiation with weekly carboplatin for AUC of 2 and paclitaxel 45 MG/M2. I discussed with the patient adverse effect of the chemotherapy including but not limited to alopecia, myelosuppression, nausea and vomiting, peripheral neuropathy, liver or renal dysfunction. I will arrange  for the patient to have a chemotherapy education class before starting the first dose of his chemotherapy. The patient will see Dr. Sondra Come later today for evaluation and discussion of the radiotherapy option. The patient has some concern about chemotherapy and I gave him the option of just proceeding with radiotherapy without chemotherapy if he has a lot of concern about chemotherapy but the side effects are usually very mild with the fractionated weekly doses of chemotherapy. If he considers the treatment he would be expected to start the first dose of this treatment on 03/04/2016. The patient was seen during the multidisciplinary thoracic oncology clinic today by medical oncology, radiation oncology, thoracic navigator, social worker and physical therapist. He would come back for follow-up visit in 3 weeks for evaluation and management of any adverse effect of his chemotherapy if he agrees to proceed with the current plan. For the chronic  back pain as well as the left sided chest pain the patient will continue his current pain medication as prescribed by his primary care physician. The patient voices understanding of current disease status and treatment options and is in agreement with the current care plan.  All questions were answered. The patient knows to call the clinic with any problems, questions or concerns. We can certainly see the patient much sooner if necessary.  Thank you so much for allowing me to participate in the care of Miguel Orr. I will continue to follow up the patient with you and assist in his care.  I spent 55 minutes counseling the patient face to face. The total time spent in the appointment was 80 minutes.  Disclaimer: This note was dictated with voice recognition software. Similar sounding words can inadvertently be transcribed and may not be corrected upon review.   Emmerich Cryer K. February 24, 2016, 3:36 PM

## 2016-02-26 ENCOUNTER — Ambulatory Visit: Payer: Medicare Other | Admitting: Radiation Oncology

## 2016-02-28 ENCOUNTER — Telehealth: Payer: Self-pay | Admitting: Oncology

## 2016-02-28 NOTE — Telephone Encounter (Signed)
Called Mr. Filsinger to see if he would be interested in 2 weeks of radiation to help with his pain.  He said he is pursuing holistic treatment now and is not interested in radiation.  He said he "does not see the point in using poison to treat poison."  Advised him to call back if he has any questions or would like radiation treatment.

## 2016-02-29 ENCOUNTER — Ambulatory Visit (HOSPITAL_COMMUNITY): Payer: Medicare Other

## 2016-02-29 ENCOUNTER — Telehealth: Payer: Self-pay | Admitting: *Deleted

## 2016-02-29 NOTE — Telephone Encounter (Signed)
Oncology Nurse Navigator Documentation  Oncology Nurse Navigator Flowsheets 02/29/2016  Navigator Encounter Type Telephone/I called to follow up on Miguel Orr.  He does not want to receive treatment but was calling to check on him and if he had any questions.  He was doing ok and had no questions at this time.   Telephone Outgoing Call  Treatment Phase Pre-Tx/Tx Discussion  Interventions Other  Acuity Level 1  Acuity Level 1 Minimal follow up required  Time Spent with Patient 15

## 2016-05-22 ENCOUNTER — Emergency Department (HOSPITAL_COMMUNITY): Payer: Medicare Other

## 2016-05-22 ENCOUNTER — Inpatient Hospital Stay (HOSPITAL_COMMUNITY)
Admission: EM | Admit: 2016-05-22 | Discharge: 2016-05-25 | DRG: 199 | Disposition: A | Discharge status: Home / Self Care | Payer: Medicare Other | Attending: Family Medicine | Admitting: Family Medicine

## 2016-05-22 ENCOUNTER — Encounter (HOSPITAL_COMMUNITY): Payer: Self-pay | Admitting: Emergency Medicine

## 2016-05-22 DIAGNOSIS — J939 Pneumothorax, unspecified: Secondary | ICD-10-CM | POA: Diagnosis not present

## 2016-05-22 DIAGNOSIS — Z809 Family history of malignant neoplasm, unspecified: Secondary | ICD-10-CM

## 2016-05-22 DIAGNOSIS — E43 Unspecified severe protein-calorie malnutrition: Secondary | ICD-10-CM | POA: Insufficient documentation

## 2016-05-22 DIAGNOSIS — H9190 Unspecified hearing loss, unspecified ear: Secondary | ICD-10-CM | POA: Diagnosis present

## 2016-05-22 DIAGNOSIS — G8929 Other chronic pain: Secondary | ICD-10-CM | POA: Diagnosis present

## 2016-05-22 DIAGNOSIS — Z7189 Other specified counseling: Secondary | ICD-10-CM

## 2016-05-22 DIAGNOSIS — J9383 Other pneumothorax: Secondary | ICD-10-CM | POA: Diagnosis not present

## 2016-05-22 DIAGNOSIS — Z66 Do not resuscitate: Secondary | ICD-10-CM

## 2016-05-22 DIAGNOSIS — F419 Anxiety disorder, unspecified: Secondary | ICD-10-CM | POA: Diagnosis not present

## 2016-05-22 DIAGNOSIS — F319 Bipolar disorder, unspecified: Secondary | ICD-10-CM | POA: Diagnosis present

## 2016-05-22 DIAGNOSIS — J439 Emphysema, unspecified: Secondary | ICD-10-CM

## 2016-05-22 DIAGNOSIS — Z79899 Other long term (current) drug therapy: Secondary | ICD-10-CM

## 2016-05-22 DIAGNOSIS — M810 Age-related osteoporosis without current pathological fracture: Secondary | ICD-10-CM | POA: Diagnosis present

## 2016-05-22 DIAGNOSIS — R64 Cachexia: Secondary | ICD-10-CM | POA: Diagnosis present

## 2016-05-22 DIAGNOSIS — J449 Chronic obstructive pulmonary disease, unspecified: Secondary | ICD-10-CM | POA: Diagnosis present

## 2016-05-22 DIAGNOSIS — Z515 Encounter for palliative care: Secondary | ICD-10-CM

## 2016-05-22 DIAGNOSIS — C3412 Malignant neoplasm of upper lobe, left bronchus or lung: Secondary | ICD-10-CM | POA: Diagnosis present

## 2016-05-22 DIAGNOSIS — E039 Hypothyroidism, unspecified: Secondary | ICD-10-CM | POA: Diagnosis present

## 2016-05-22 DIAGNOSIS — R634 Abnormal weight loss: Secondary | ICD-10-CM

## 2016-05-22 DIAGNOSIS — Z8249 Family history of ischemic heart disease and other diseases of the circulatory system: Secondary | ICD-10-CM

## 2016-05-22 DIAGNOSIS — F329 Major depressive disorder, single episode, unspecified: Secondary | ICD-10-CM | POA: Diagnosis present

## 2016-05-22 DIAGNOSIS — F17210 Nicotine dependence, cigarettes, uncomplicated: Secondary | ICD-10-CM | POA: Diagnosis present

## 2016-05-22 DIAGNOSIS — C3492 Malignant neoplasm of unspecified part of left bronchus or lung: Secondary | ICD-10-CM

## 2016-05-22 DIAGNOSIS — K861 Other chronic pancreatitis: Secondary | ICD-10-CM | POA: Diagnosis present

## 2016-05-22 DIAGNOSIS — Z681 Body mass index (BMI) 19 or less, adult: Secondary | ICD-10-CM

## 2016-05-22 DIAGNOSIS — Z974 Presence of external hearing-aid: Secondary | ICD-10-CM

## 2016-05-22 DIAGNOSIS — F32A Depression, unspecified: Secondary | ICD-10-CM | POA: Diagnosis present

## 2016-05-22 DIAGNOSIS — Z7982 Long term (current) use of aspirin: Secondary | ICD-10-CM

## 2016-05-22 DIAGNOSIS — Z885 Allergy status to narcotic agent status: Secondary | ICD-10-CM

## 2016-05-22 DIAGNOSIS — Z980 Intestinal bypass and anastomosis status: Secondary | ICD-10-CM

## 2016-05-22 LAB — CBC WITH DIFFERENTIAL/PLATELET
BASOS ABS: 0 10*3/uL (ref 0.0–0.1)
Basophils Relative: 0 %
EOS PCT: 1 %
Eosinophils Absolute: 0.1 10*3/uL (ref 0.0–0.7)
HCT: 40.1 % (ref 39.0–52.0)
Hemoglobin: 13.4 g/dL (ref 13.0–17.0)
LYMPHS PCT: 16 %
Lymphs Abs: 1.3 10*3/uL (ref 0.7–4.0)
MCH: 31 pg (ref 26.0–34.0)
MCHC: 33.4 g/dL (ref 30.0–36.0)
MCV: 92.8 fL (ref 78.0–100.0)
MONO ABS: 0.6 10*3/uL (ref 0.1–1.0)
Monocytes Relative: 8 %
Neutro Abs: 5.7 10*3/uL (ref 1.7–7.7)
Neutrophils Relative %: 75 %
PLATELETS: 144 10*3/uL — AB (ref 150–400)
RBC: 4.32 MIL/uL (ref 4.22–5.81)
RDW: 13.3 % (ref 11.5–15.5)
WBC: 7.6 10*3/uL (ref 4.0–10.5)

## 2016-05-22 LAB — URINALYSIS, ROUTINE W REFLEX MICROSCOPIC
BILIRUBIN URINE: NEGATIVE
Glucose, UA: NEGATIVE mg/dL
HGB URINE DIPSTICK: NEGATIVE
Ketones, ur: NEGATIVE mg/dL
Leukocytes, UA: NEGATIVE
Nitrite: NEGATIVE
Protein, ur: NEGATIVE mg/dL
SPECIFIC GRAVITY, URINE: 1.027 (ref 1.005–1.030)
pH: 5 (ref 5.0–8.0)

## 2016-05-22 LAB — COMPREHENSIVE METABOLIC PANEL
ALBUMIN: 3.8 g/dL (ref 3.5–5.0)
ALK PHOS: 63 U/L (ref 38–126)
ALT: 13 U/L — AB (ref 17–63)
AST: 22 U/L (ref 15–41)
Anion gap: 8 (ref 5–15)
BILIRUBIN TOTAL: 0.5 mg/dL (ref 0.3–1.2)
BUN: 19 mg/dL (ref 6–20)
CALCIUM: 9.4 mg/dL (ref 8.9–10.3)
CO2: 27 mmol/L (ref 22–32)
CREATININE: 0.9 mg/dL (ref 0.61–1.24)
Chloride: 101 mmol/L (ref 101–111)
GFR calc Af Amer: >60 mL/min (ref >60)
GFR calc non Af Amer: >60 mL/min (ref >60)
GLUCOSE: 111 mg/dL — AB (ref 65–99)
Potassium: 4.2 mmol/L (ref 3.5–5.1)
Sodium: 136 mmol/L (ref 135–145)
TOTAL PROTEIN: 7.4 g/dL (ref 6.5–8.1)

## 2016-05-22 LAB — I-STAT TROPONIN, ED: Troponin i, poc: 0 ng/mL (ref 0.00–0.08)

## 2016-05-22 LAB — TSH: TSH: 0.03 u[IU]/mL — ABNORMAL LOW (ref 0.350–4.500)

## 2016-05-22 MED ORDER — HYDROCODONE-ACETAMINOPHEN 10-325 MG PO TABS
0.5000 | ORAL_TABLET | Freq: Three times a day (TID) | ORAL | Status: DC | PRN
  Administered 2016-05-22 (×2): 0.5 via ORAL
  Administered 2016-05-23: 1 via ORAL
  Administered 2016-05-23 – 2016-05-24 (×5): 0.5 via ORAL
  Administered 2016-05-25 (×2): 1 via ORAL
  Filled 2016-05-22 (×11): qty 1

## 2016-05-22 MED ORDER — POLYETHYLENE GLYCOL 3350 17 G PO PACK
17.0000 g | PACK | Freq: Every day | ORAL | Status: DC | PRN
  Administered 2016-05-24: 17 g via ORAL
  Filled 2016-05-22: qty 1

## 2016-05-22 MED ORDER — ACETAMINOPHEN 650 MG RE SUPP: 650.0000 mg | Freq: Four times a day (QID) | RECTAL | Status: DC | PRN

## 2016-05-22 MED ORDER — ENOXAPARIN SODIUM 30 MG/0.3ML ~~LOC~~ SOLN
30.0000 mg | SUBCUTANEOUS | Status: DC
  Administered 2016-05-22 – 2016-05-24 (×3): 30 mg via SUBCUTANEOUS
  Filled 2016-05-22 (×3): qty 0.3

## 2016-05-22 MED ORDER — FENTANYL CITRATE (PF) 100 MCG/2ML IJ SOLN
100.0000 ug | Freq: Once | INTRAMUSCULAR | Status: AC
  Administered 2016-05-22: 100 ug via INTRAVENOUS
  Filled 2016-05-22: qty 2

## 2016-05-22 MED ORDER — KETOROLAC TROMETHAMINE 15 MG/ML IJ SOLN
INTRAMUSCULAR | Status: AC
  Filled 2016-05-22: qty 1

## 2016-05-22 MED ORDER — HYDROMORPHONE HCL 2 MG/ML IJ SOLN
0.5000 mg | Freq: Once | INTRAMUSCULAR | Status: AC
  Administered 2016-05-22: 0.5 mg via INTRAVENOUS
  Filled 2016-05-22: qty 1

## 2016-05-22 MED ORDER — LEVALBUTEROL HCL 0.63 MG/3ML IN NEBU: 0.6300 mg | INHALATION_SOLUTION | Freq: Four times a day (QID) | RESPIRATORY_TRACT | Status: DC | PRN

## 2016-05-22 MED ORDER — ALPRAZOLAM 0.25 MG PO TABS
0.2500 mg | ORAL_TABLET | Freq: Every evening | ORAL | Status: DC | PRN
  Administered 2016-05-22 – 2016-05-24 (×3): 0.25 mg via ORAL
  Filled 2016-05-22 (×3): qty 1

## 2016-05-22 MED ORDER — KETOROLAC TROMETHAMINE 15 MG/ML IJ SOLN
15.0000 mg | Freq: Once | INTRAMUSCULAR | Status: AC
  Administered 2016-05-22: 15 mg via INTRAVENOUS

## 2016-05-22 MED ORDER — ACETAMINOPHEN 325 MG PO TABS
650.0000 mg | ORAL_TABLET | Freq: Four times a day (QID) | ORAL | Status: DC | PRN
  Administered 2016-05-24: 650 mg via ORAL
  Filled 2016-05-22: qty 2

## 2016-05-22 MED ORDER — OXYCODONE HCL 5 MG PO TABS
5.0000 mg | ORAL_TABLET | ORAL | Status: DC | PRN
  Administered 2016-05-22 – 2016-05-24 (×5): 5 mg via ORAL
  Filled 2016-05-22 (×5): qty 1

## 2016-05-22 MED ORDER — ONDANSETRON HCL 4 MG/2ML IJ SOLN: 4.0000 mg | Freq: Four times a day (QID) | INTRAMUSCULAR | Status: DC | PRN

## 2016-05-22 MED ORDER — ONDANSETRON HCL 4 MG PO TABS: 4.0000 mg | ORAL_TABLET | Freq: Four times a day (QID) | ORAL | Status: DC | PRN

## 2016-05-22 MED ORDER — SODIUM CHLORIDE 0.9 % IV SOLN
INTRAVENOUS | Status: DC
  Administered 2016-05-22 – 2016-05-25 (×4): via INTRAVENOUS

## 2016-05-22 MED ORDER — LEVOTHYROXINE SODIUM 100 MCG PO TABS
200.0000 ug | ORAL_TABLET | Freq: Every day | ORAL | Status: DC
  Administered 2016-05-23 – 2016-05-25 (×3): 200 ug via ORAL
  Filled 2016-05-22 (×3): qty 2

## 2016-05-22 NOTE — ED Notes (Signed)
Bed: WA02 Expected date:  Expected time:  Means of arrival:  Comments: EMs, side pain

## 2016-05-22 NOTE — ED Notes (Signed)
He is sitting upright and is awake, alert and oriented and in no distress.

## 2016-05-22 NOTE — ED Notes (Signed)
After a time-out; Dr. Laneta Simmers after applying sterile drapes from head-to-toe after sterile prep had been done and requisite consent obtained inserts left pleural tube after infiltrating with local anesthesia. Pt. Tol. Very well.

## 2016-05-22 NOTE — ED Triage Notes (Signed)
Patient was diagnosed with cancer and has been doing home health treatment.  He was refusing treatment.  Patient is complaining of left sided pain and chest pain since  last night.    BP: 142/78 HR: 90 R:22  O2: 94-93% room air

## 2016-05-22 NOTE — Consult Note (Signed)
Name: Miguel Orr MRN: [id number] DOB: March 20, 1941    ADMISSION DATE:  05/22/2016 CONSULTATION DATE:  2/281/18  REFERRING MD :  Dr. Allyson Sabal   CHIEF COMPLAINT:  Spontaneous Pneumothorax    HISTORY OF PRESENT ILLNESS:  76 y/o M who presented to [facility] on 2/28 with acute onset left sided chest pain and shortness of breath.    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  He was previously evaluated by Dr. Julien Nordmann and Dwana Curd in November of 2017 but refused therapy for lung cancer. He has been using a cannabis oil and gingko leaves TID for the cancer.  He felt he was trading a poison for a poison.  He reported to the ER with a two day history of chest pain that went into his arm. Work up showed a 50% pneumothorax on CXR.  Troponin negative.  Labs within normal limits.  A pigtail chest tube was placed per the EDP. Follow up CXR demonstrated near resolution of pneumothorax.    He reports he is getting ready to move to [place] to live with his son in 3 weeks.   PCCM called for evaluation.   PAST MEDICAL HISTORY :   has a past medical history of Anxiety; Chronic back pain; Chronic generalized abdominal pain; Chronic pancreatitis ([place]); Depression; Hearing loss; History of vertebral compression fracture; Hypothyroidism; Mass of upper lobe of left lung; Osteoporosis; Stage III squamous cell carcinoma of left lung ([place]) (02/24/2016); and Substance abuse.   has a past surgical history that includes Eye surgery (Bilateral, 05/2011); Appendectomy; Bowel resection (1993); Mesenteric artery bypass (1993); and RECTAL ABSCESS.  Prior to Admission medications   Medication Sig Start Date End Date Taking? Authorizing Provider  alendronate (FOSAMAX) 70 MG tablet Take 70 mg by mouth every Monday. Take with a full glass of water on an empty stomach.    Yes Historical Provider, MD  ALPRAZolam Duanne Moron) 0.25 MG tablet Take 0.25 mg by mouth at bedtime as  needed for anxiety or sleep.    Yes Historical Provider, MD  aspirin EC 325 MG tablet Take 325 mg by mouth at bedtime.   Yes Historical Provider, MD  Ginkgo Biloba 60 MG CAPS Take 180 mg by mouth 2 (two) times daily.    Yes Historical Provider, MD  HYDROcodone-acetaminophen (NORCO) 10-325 MG tablet Take 0.5-1 tablets by mouth 3 (three) times daily as needed for moderate pain.    Yes Historical Provider, MD  levothyroxine (SYNTHROID, LEVOTHROID) 200 MCG tablet Take 200 mcg by mouth daily before breakfast.   Yes Historical Provider, MD  OVER THE COUNTER MEDICATION Take 20 drops by mouth 2 (two) times daily. CBD oil   Yes Historical Provider, MD    Allergies  Allergen Reactions  . Dilaudid [Hydromorphone Hcl] Other (See Comments)    Reaction: Hallucinations     FAMILY HISTORY:  family history includes Alzheimer's disease in his sister; Cancer in his brother; Hyperlipidemia in his mother; Hypertension in his brother; Pneumonia in his brother and father.  SOCIAL HISTORY:  reports that he has been smoking Cigarettes.  He has a 60.00 pack-year smoking history. He has never used smokeless tobacco. He reports that he does not drink alcohol.  REVIEW OF SYSTEMS:  POSITIVES IN BOLD Constitutional: Negative for fever, chills, weight loss, malaise/fatigue and diaphoresis.  HENT: Negative for hearing loss, ear pain, nosebleeds, congestion, sore throat, neck pain, tinnitus and ear discharge.   Eyes: Negative for  blurred vision, double vision, photophobia, pain, discharge and redness.  Respiratory: Negative for cough, hemoptysis, sputum production, shortness of breath, wheezing and stridor.   Cardiovascular: Negative for chest pain, palpitations, orthopnea, claudication, leg swelling and PND.  Gastrointestinal: Negative for heartburn, nausea, vomiting, abdominal pain, diarrhea, constipation, blood in stool and melena.  Genitourinary: Negative for dysuria, urgency, frequency, hematuria and flank pain.    Musculoskeletal: Negative for myalgias, back pain, joint pain and falls.  Skin: Negative for itching and rash.  Neurological: Negative for dizziness, tingling, tremors, sensory change, speech change, focal weakness, seizures, loss of consciousness, weakness and headaches.  Endo/Heme/Allergies: Negative for environmental allergies and polydipsia. Does not bruise/bleed easily.  SUBJECTIVE: Pt reports ongoing chest pain   VITAL SIGNS: Temp:  [97.8 F (36.6 C)] 97.8 F (36.6 C) (02/28 1111) Pulse Rate:  [74-92] 74 (02/28 1415) Resp:  [13-32] 16 (02/28 1415) BP: (140)/(68-86) 140/68 (02/28 1158) SpO2:  [90 %-96 %] 96 % (02/28 1415) Weight:  [100 lb (45.4 kg)] 100 lb (45.4 kg) (02/28 1111)  PHYSICAL EXAMINATION: General:  Cachectic adult male in NAD Neuro:  AAOx4, speech clear, MAE  HEENT:  MM pink/dry, no jvd Cardiovascular:  s1s2 rrr, no m/r/g  Lungs:  Even/non-labored, diminished bilaterally, L CT to 20 cm suction with 1/7 air leak  Abdomen:  Soft, bsx4 active  Musculoskeletal:  No acute deformities  Skin:  Warm/dry, no edema    Recent Labs Lab 05/22/16 1155  NA 136  K 4.2  CL 101  CO2 27  BUN 19  CREATININE 0.90  GLUCOSE 111*     Recent Labs Lab 05/22/16 1155  HGB 13.4  HCT 40.1  WBC 7.6  PLT 144*    Dg Chest 2 View  Result Date: 05/22/2016 CLINICAL DATA:  Worsening shortness breath over the past 2 days. Known lung malignancy. No known trauma. EXAM: CHEST  2 VIEW COMPARISON:  PA and lateral chest x-ray dated December 29, 2015 FINDINGS: There is a new approximately 50% left-sided pneumothorax. There is no mediastinal shift. The right lung is well-expanded. The interstitial markings of both lungs are coarse. There may be a tiny amount of pleural fluid at the left lung base. The heart is small. The pulmonary vascularity is not engorged. There is wedge compression of T8 and T9 and T10 and T12 which is been previously demonstrated. IMPRESSION: New 50% left-sided  pneumothorax. Underlying COPD and pulmonary fibrotic changes. These results were called by telephone at the time of interpretation on 05/22/2016 at 11:43 am to Dr. Waynetta Pean , who verbally acknowledged these results. Electronically Signed   By: David  Martinique M.D.   On: 05/22/2016 11:44   Dg Chest Port 1 View  Result Date: 05/22/2016 CLINICAL DATA:  Left-sided chest tube, history of squamous cell carcinoma of the left lung, also history of substance abuse EXAM: PORTABLE CHEST 1 VIEW COMPARISON:  Chest x-ray of 05/22/2016 FINDINGS: After insertion of the left chest tube only a tiny left apical pneumothorax is present of less than 5%. Chronic changes remain bilaterally. Asymmetric opacity in the left lung apex also stable. IMPRESSION: Only a tiny left apical pneumothorax remains after insertion of left chest tube. Electronically Signed   By: Ivar Drape M.D.   On: 05/22/2016 13:35      SIGNIFICANT EVENTS  2/28  Admit with spontaneous PTX  STUDIES:  CXR 2/28 >> 50 % L pneumothorax   ASSESSMENT / PLAN:  Discussion: 76 y/o M with PMH of stage III squamous cell lung  cancer admitted with spontaneous pneumothorax. S/P chest tube placement in the ER.     1. Spontaneous Pneumothrax - ~50% ptx on admit, s/p chest tube placement.  Continue chest tube to 20 cm suction.  Repeat CXR in am, if lung up will trial water seal with repeat film in 4 hours.   2. Chest Pain - secondary to PTX, pleural irritation due to chest tube.  Toradol 15 mg IV x1.  Defer further pain management to primary MD. 3. Squamous Cell Carcinoma - diagnosed by CT biopsy, evaluated by Dr. Sondra Come + Mohamed in 11/207 but refused therapy.  Discussed palliative care options with the patient.  He is open to the concept but is anticipating a move to [place] in the next few weeks.   4. COPD - not on home O2, xopenex PRN.  Supplemental O2 as needed for sats >90% 5. Severe Protein Calorie Malnutrition - defer to primary, consider nutrition  consult    Noe Gens, NP-C [facility] Pgr: [phone] or if no answer [phone] 05/22/2016, 2:21 PM

## 2016-05-22 NOTE — ED Notes (Signed)
Patient given urinal and made aware of needed urine sample.

## 2016-05-22 NOTE — ED Provider Notes (Signed)
[facility] DEPT Provider Note   CSN: [id number] Arrival date & time: 05/22/16  1100     History   Chief Complaint Chief Complaint  Patient presents with  . Chest Pain  . Back Pain    HPI Miguel Orr is a 76 y.o. male.  Miguel Orr is a 76 y.o. Male with a history of lung cancer who presents to the ED complaining of sudden onset of left-sided chest pain that started last night. Patient reports he is lying in bed when he began having left-sided chest pain. He reports feeling increasingly short of breath that is much worse with exertion. Patient was diagnosed with left-sided lung cancer this past November and has been on homeopathic treatment only since then. He is not doing chemo or radiation therapy. He has not seen palliative care. He denies having any advance care directives. He does not use oxygen at home. Patient is still smoking. He denies fevers, hemoptysis, leg swelling, abdominal pain, nausea, vomiting, or syncope.   The history is provided by the patient, medical records and the spouse. No language interpreter was used.  Chest Pain   Associated symptoms include back pain, cough and shortness of breath. Pertinent negatives include no abdominal pain, no fever, no headaches, no nausea, no palpitations and no vomiting.  Back Pain   Associated symptoms include chest pain. Pertinent negatives include no fever, no headaches, no abdominal pain and no dysuria.    Past Medical History:  Diagnosis Date  . Anxiety   . Chronic back pain   . Chronic generalized abdominal pain   . Chronic pancreatitis ([place])   . Depression   . Hearing loss    HAS HEARING AIDES  . History of vertebral compression fracture    T8, 10, 11, 12  . 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 Mass of upper lobe of left lung    CXR 12/29/15, CT CHEST 1019/17, PET 01/23/16  . Osteoporosis   . Stage III squamous cell carcinoma of left lung ([place]) 02/24/2016  . Substance abuse    TOBACCO    Patient  Active Problem List   Diagnosis Date Noted  . Pneumothorax on left 05/22/2016  . Stage III squamous cell carcinoma of left lung ([place]) 02/24/2016  . Encounter for antineoplastic chemotherapy 02/24/2016  . Osteoporosis   . Thyroid disease   . Chronic pancreatitis ([place])   . Chronic back pain   . Chronic generalized abdominal pain   . Hearing loss   . Anxiety   . Depression   . History of vertebral compression fracture   . Substance abuse   . Mass of upper lobe of left lung     Past Surgical History:  Procedure Laterality Date  . APPENDECTOMY    . BOWEL RESECTION  1993  . EYE SURGERY Bilateral 05/2011   DR. SHAPIRO  . MESENTERIC ARTERY BYPASS  1993  . RECTAL ABSCESS         Home Medications    Prior to Admission medications   Medication Sig Start Date End Date Taking? Authorizing Provider  alendronate (FOSAMAX) 70 MG tablet Take 70 mg by mouth every Monday. Take with a full glass of water on an empty stomach.    Yes Historical Provider, MD  ALPRAZolam Duanne Moron) 0.25 MG tablet Take 0.25 mg by mouth at bedtime as needed for anxiety or sleep.    Yes Historical Provider, MD  aspirin EC 325 MG tablet Take 325 mg by mouth at bedtime.   Yes  Historical Provider, MD  Ginkgo Biloba 60 MG CAPS Take 180 mg by mouth 2 (two) times daily.    Yes Historical Provider, MD  HYDROcodone-acetaminophen (NORCO) 10-325 MG tablet Take 0.5-1 tablets by mouth 3 (three) times daily as needed for moderate pain.    Yes Historical Provider, MD  levothyroxine (SYNTHROID, LEVOTHROID) 200 MCG tablet Take 200 mcg by mouth daily before breakfast.   Yes Historical Provider, MD  OVER THE COUNTER MEDICATION Take 20 drops by mouth 2 (two) times daily. CBD oil   Yes Historical Provider, MD    Family History Family History  Problem Relation Age of Onset  . Hyperlipidemia Mother   . Pneumonia Father   . Alzheimer's disease Sister   . Pneumonia Brother   . Cancer Brother     BONE MARROW  . Hypertension Brother       Social History Social History  Substance Use Topics  . Smoking status: Current Every Day Smoker    Packs/day: 1.00    Years: 60.00    Types: Cigarettes  . Smokeless tobacco: Never Used  . Alcohol use No     Allergies   Dilaudid [hydromorphone hcl]   Review of Systems Review of Systems  Constitutional: Negative for chills and fever.  HENT: Negative for congestion and sore throat.   Eyes: Negative for visual disturbance.  Respiratory: Positive for cough and shortness of breath. Negative for wheezing.   Cardiovascular: Positive for chest pain. Negative for palpitations and leg swelling.  Gastrointestinal: Negative for abdominal pain, diarrhea, nausea and vomiting.  Genitourinary: Negative for dysuria.  Musculoskeletal: Positive for back pain. Negative for neck pain.  Skin: Negative for rash.  Neurological: Negative for light-headedness and headaches.     Physical Exam Updated Vital Signs BP 159/73 (BP Location: Left Arm)   Pulse 74   Temp 97.8 F (36.6 C) (Oral)   Resp 16   Ht '5\' 6"'$  (1.676 m)   Wt 45.4 kg   SpO2 96%   BMI 16.14 kg/m   Physical Exam  Constitutional: He appears well-developed and well-nourished. No distress.  HENT:  Head: Normocephalic and atraumatic.  Mouth/Throat: Oropharynx is clear and moist.  Eyes: Conjunctivae are normal. Pupils are equal, round, and reactive to light. Right eye exhibits no discharge. Left eye exhibits no discharge.  Neck: Neck supple.  Cardiovascular: Normal rate, regular rhythm, normal heart sounds and intact distal pulses.  Exam reveals no gallop and no friction rub.   No murmur heard. Pulmonary/Chest: Effort normal. No respiratory distress. He has no wheezes. He has no rales.  Lung sounds diminished to his left lung fields. No increased work of breathing.   Abdominal: Soft. There is no tenderness.  Musculoskeletal: He exhibits no edema or tenderness.  No LE edema or TTP.   Lymphadenopathy:    He has no cervical  adenopathy.  Neurological: He is alert. Coordination normal.  Skin: Skin is warm and dry. Capillary refill takes less than 2 seconds. No rash noted. He is not diaphoretic. No erythema. No pallor.  Psychiatric: He has a normal mood and affect. His behavior is normal.  Nursing note and vitals reviewed.    ED Treatments / Results  Labs (all labs ordered are listed, but only abnormal results are displayed) Labs Reviewed  COMPREHENSIVE METABOLIC PANEL - Abnormal; Notable for the following:       Result Value   Glucose, Bld 111 (*)    ALT 13 (*)    All other components within normal limits  CBC WITH DIFFERENTIAL/PLATELET - Abnormal; Notable for the following:    Platelets 144 (*)    All other components within normal limits  URINALYSIS, ROUTINE W REFLEX MICROSCOPIC  CBC  CREATININE, SERUM  TSH  I-STAT TROPOININ, ED    EKG  EKG Interpretation  Date/Time:  Wednesday May 22 2016 11:27:51 EST Ventricular Rate:  78 PR Interval:    QRS Duration: 80 QT Interval:  377 QTC Calculation: 430 R Axis:   92 Text Interpretation:  Sinus rhythm Anterolateral infarct, old Baseline wander in lead(s) V3 Otherwise normal ECG No previous tracing Confirmed by KNOTT MD, DANIEL [id number]) on 05/22/2016 1:12:09 PM       Radiology Dg Chest 2 View  Result Date: 05/22/2016 CLINICAL DATA:  Worsening shortness breath over the past 2 days. Known lung malignancy. No known trauma. EXAM: CHEST  2 VIEW COMPARISON:  PA and lateral chest x-ray dated December 29, 2015 FINDINGS: There is a new approximately 50% left-sided pneumothorax. There is no mediastinal shift. The right lung is well-expanded. The interstitial markings of both lungs are coarse. There may be a tiny amount of pleural fluid at the left lung base. The heart is small. The pulmonary vascularity is not engorged. There is wedge compression of T8 and T9 and T10 and T12 which is been previously demonstrated. IMPRESSION: New 50% left-sided pneumothorax.  Underlying COPD and pulmonary fibrotic changes. These results were called by telephone at the time of interpretation on 05/22/2016 at 11:43 am to Dr. Waynetta Pean , who verbally acknowledged these results. Electronically Signed   By: David  Martinique M.D.   On: 05/22/2016 11:44   Dg Chest Port 1 View  Result Date: 05/22/2016 CLINICAL DATA:  Left-sided chest tube, history of squamous cell carcinoma of the left lung, also history of substance abuse EXAM: PORTABLE CHEST 1 VIEW COMPARISON:  Chest x-ray of 05/22/2016 FINDINGS: After insertion of the left chest tube only a tiny left apical pneumothorax is present of less than 5%. Chronic changes remain bilaterally. Asymmetric opacity in the left lung apex also stable. IMPRESSION: Only a tiny left apical pneumothorax remains after insertion of left chest tube. Electronically Signed   By: Ivar Drape M.D.   On: 05/22/2016 13:35    Procedures .Critical Care Performed by: Waynetta Pean Authorized by: Waynetta Pean   Critical care provider statement:    Critical care time (minutes):  35   Critical care time was exclusive of:  Separately billable procedures and treating other patients and teaching time   Critical care was necessary to treat or prevent imminent or life-threatening deterioration of the following conditions: spontaneous pnuemothorax    Critical care was time spent personally by me on the following activities:  Evaluation of patient's response to treatment, examination of patient, development of treatment plan with patient or surrogate, discussions with consultants, obtaining history from patient or surrogate, review of old charts, re-evaluation of patient's condition, pulse oximetry, ordering and review of radiographic studies, ordering and review of laboratory studies and ordering and performing treatments and interventions   (including critical care time)  Medications Ordered in ED Medications  enoxaparin (LOVENOX) injection 40 mg (not  administered)  0.9 %  sodium chloride infusion (not administered)  acetaminophen (TYLENOL) tablet 650 mg (not administered)    Or  acetaminophen (TYLENOL) suppository 650 mg (not administered)  oxyCODONE (Oxy IR/ROXICODONE) immediate release tablet 5 mg (not administered)  polyethylene glycol (MIRALAX / GLYCOLAX) packet 17 g (not administered)  ondansetron (ZOFRAN) tablet 4 mg (not administered)  Or  ondansetron (ZOFRAN) injection 4 mg (not administered)  levalbuterol (XOPENEX) nebulizer solution 0.63 mg (not administered)  ketorolac (TORADOL) 15 MG/ML injection (not administered)  fentaNYL (SUBLIMAZE) injection 100 mcg (100 mcg Intravenous Given 05/22/16 1207)  HYDROmorphone (DILAUDID) injection 0.5 mg (0.5 mg Intravenous Given 05/22/16 1315)  ketorolac (TORADOL) 15 MG/ML injection 15 mg (15 mg Intravenous Given 05/22/16 1511)     Initial Impression / Assessment and Plan / ED Course  I have reviewed the triage vital signs and the nursing notes.  Pertinent labs & imaging results that were available during my care of the patient were reviewed by me and considered in my medical decision making (see chart for details).    This is a 76 y.o. Male with a history of lung cancer who presents to the ED complaining of sudden onset of left-sided chest pain that started last night. Patient reports he is lying in bed when he began having left-sided chest pain. He reports feeling increasingly short of breath that is much worse with exertion. Patient was diagnosed with left-sided lung cancer this past November and has been on homeopathic treatment only since then. He is not doing chemo or radiation therapy.  On exam the patient is afebrile nontoxic appearing. He has diminished lung sounds to his left lung fields. No increased work of breathing. He is on 2 L via nasal cannula with an oxygen saturation of 93%.   Chest x-ray reveals 50% left-sided pneumothorax.  Troponin is not elevated. CBC and BMP are  unremarkable. EKG shows no significant change from his last tracing.  Chest tube was placed by Dr. Laneta Simmers with myself at bedside. Patient tolerated the procedure well. He reports feeling much better after chest tube was placed. Patient agrees with plan for admission.   Repeat chest x-ray shows only a tiny left apical pneumothorax remaining after insertion of the chest tube.  I consulted with Dr. Allyson Sabal who accepted the patient for admission. She requested I speak with pulmonology/critical care medicine about the patient due to his chest tube.   I consulted with pulmonology who will send their team to evaluate the patient and will follow him.   This patient was discussed with and evaluated by Dr. Laneta Simmers who agrees with assessment and plan.    Final Clinical Impressions(s) / ED Diagnoses   Final diagnoses:  Spontaneous pneumothorax    New Prescriptions New Prescriptions   No medications on file     Waynetta Pean, PA-C 05/22/16 1527    Leo Grosser, MD 05/22/16 [phone]

## 2016-05-22 NOTE — ED Notes (Signed)
Patient transported to X-ray 

## 2016-05-22 NOTE — H&P (Addendum)
[facility] History and Physical  Miguel Orr NLZ:[id number] DOB: 02/07/1941 DOA: 05/22/2016  Referring physician:  ER  PCP: [name], MD   Chief Complaint:  Shortness of breath  HPI:   76 year old male with a history of recent diagnosis of history significant for chronic pancreatitis, hypothyroidism, chronic back pain, hearing loss, anxiety/depression and long history of smoking., lung cancer, in November 2017, consistent with squamous cell carcinoma, diagnosed with a CT-guided lung biopsy, previously evaluated by radiation oncology, Dr. Sondra Come and Dr. Julien Nordmann in November 2017, but never started on treatment, because of patient refusal, who comes to the ER today with left-sided chest pain radiating to the left arm, associated with shortness of breath. Patient has declined radiation therapy and chemotherapy, states that "I don't want to poison my body", and is currently on homeopathic treatment for his lung cancer. Patient appears to be Chronically ill appearing, admits to have lost a lot of weight but  he cannot quantify, how much weight he has lost Chest x-ray consistent with large spontaneous pneumothorax, percutaneous chest tube was placed by EDP. Admission requested for patient to be observed overnight. EDP also contacted PCCM for further evaluation of patient's pneumothorax.  ED course BP 140/86   Pulse 92   Temp 97.8 F (36.6 C) (Oral)   Resp 16   Ht '5\' 6"'$  (1.676 m)   Wt 45.4 kg   SpO2 90% Patient currently breathing comfortably and is able to speak in full sentences     Review of Systems: negative for the following  Constitutional: Denies fever, chills, diaphoresis, appetite change and fatigue.  HEENT: Denies photophobia, eye pain, redness, hearing loss, ear pain, congestion, sore throat, rhinorrhea, sneezing, mouth sores, trouble swallowing, neck pain, neck stiffness and tinnitus.  Respiratory: Positive for cough and shortness of breath. Negative for wheezing.    Cardiovascular: Positive for chest pain. Negative for palpitations and leg swelling. Gastrointestinal: Denies nausea, vomiting, abdominal pain, diarrhea, constipation, blood in stool and abdominal distention.  Genitourinary: Denies dysuria, urgency, frequency, hematuria, flank pain and difficulty urinating.  Musculoskeletal: Denies myalgias, back pain, joint swelling, arthralgias and gait problem.  Skin: Denies pallor, rash and wound.  Neurological: Denies dizziness, seizures, syncope, weakness, light-headedness, numbness and headaches.  Hematological: Denies adenopathy. Easy bruising, personal or family bleeding history  Psychiatric/Behavioral: Denies suicidal ideation, mood changes, confusion, nervousness, sleep disturbance and agitation       Past Medical History:  Diagnosis Date  . Anxiety   . Chronic back pain   . Chronic generalized abdominal pain   . Chronic pancreatitis ([place])   . Depression   . Hearing loss    HAS HEARING AIDES  . History of vertebral compression fracture    T8, 10, 11, 12  . 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 Mass of upper lobe of left lung    CXR 12/29/15, CT CHEST 1019/17, PET 01/23/16  . Osteoporosis   . 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 Substance abuse    TOBACCO     Past Surgical History:  Procedure Laterality Date  . APPENDECTOMY    . BOWEL RESECTION  1993  . EYE SURGERY Bilateral 05/2011   DR. SHAPIRO  . MESENTERIC ARTERY BYPASS  1993  . RECTAL ABSCESS        Social History:  reports that he has been smoking Cigarettes.  He has a 60.00 pack-year smoking history. He has never used smokeless tobacco. He reports that he does not drink  alcohol. His drug history is not on file.    Allergies  Allergen Reactions  . Dilaudid [Hydromorphone Hcl] Other (See Comments)    Reaction: Hallucinations     Family History  Problem Relation Age of Onset  . Hyperlipidemia Mother   . Pneumonia Father   . Alzheimer's  disease Sister   . Pneumonia Brother   . Cancer Brother     BONE MARROW  . Hypertension Brother          Prior to Admission medications   Medication Sig Start Date End Date Taking? Authorizing Provider  alendronate (FOSAMAX) 70 MG tablet Take 70 mg by mouth every Monday. Take with a full glass of water on an empty stomach.    Yes Historical Provider, MD  ALPRAZolam Duanne Moron) 0.25 MG tablet Take 0.25 mg by mouth at bedtime as needed for anxiety or sleep.    Yes Historical Provider, MD  aspirin EC 325 MG tablet Take 325 mg by mouth at bedtime.   Yes Historical Provider, MD  Ginkgo Biloba 60 MG CAPS Take 180 mg by mouth 2 (two) times daily.    Yes Historical Provider, MD  HYDROcodone-acetaminophen (NORCO) 10-325 MG tablet Take 0.5-1 tablets by mouth 3 (three) times daily as needed for moderate pain.    Yes Historical Provider, MD  levothyroxine (SYNTHROID, LEVOTHROID) 200 MCG tablet Take 200 mcg by mouth daily before breakfast.   Yes Historical Provider, MD  OVER THE COUNTER MEDICATION Take 20 drops by mouth 2 (two) times daily. CBD oil   Yes Historical Provider, MD     Physical Exam: Vitals:   05/22/16 1111 05/22/16 1158  BP: 140/86 140/68  Pulse: 92 77  Resp: 16 20  Temp: 97.8 F (36.6 C)   TempSrc: Oral   SpO2: 90% 94%  Weight: 45.4 kg (100 lb)   Height: '5\' 6"'$  (1.676 m)       Constitutional: NAD, calm, comfortable Vitals:   05/22/16 1111 05/22/16 1158  BP: 140/86 140/68  Pulse: 92 77  Resp: 16 20  Temp: 97.8 F (36.6 C)   TempSrc: Oral   SpO2: 90% 94%  Weight: 45.4 kg (100 lb)   Height: '5\' 6"'$  (1.676 m)    Eyes: PERRL, lids and conjunctivae normal ENMT: Mucous membranes are moist. Posterior pharynx clear of any exudate or lesions.Normal dentition.  Neck: normal, supple, no masses, no thyromegaly Respiratory: clear to auscultation bilaterally, no wheezing, no crackles. Normal respiratory effort. No accessory muscle use.  Cardiovascular: Regular rate and rhythm, no  murmurs / rubs / gallops. No extremity edema. 2+ pedal pulses. No carotid bruits.  Abdomen: no tenderness, no masses palpated. No hepatosplenomegaly. Bowel sounds positive.  Musculoskeletal: no clubbing / cyanosis. No joint deformity upper and lower extremities. Good ROM, no contractures. Normal muscle tone.  Skin: no rashes, lesions, ulcers. No induration Neurologic: CN 2-12 grossly intact. Sensation intact, DTR normal. Strength 5/5 in all 4.  Psychiatric: Normal judgment and insight. Alert and oriented x 3. Normal mood.     Labs on Admission: I have personally reviewed following labs and imaging studies  CBC:  Recent Labs Lab 05/22/16 1155  WBC 7.6  NEUTROABS 5.7  HGB 13.4  HCT 40.1  MCV 92.8  PLT 144*    Basic Metabolic Panel:  Recent Labs Lab 05/22/16 1155  NA 136  K 4.2  CL 101  CO2 27  GLUCOSE 111*  BUN 19  CREATININE 0.90  CALCIUM 9.4    GFR: Estimated Creatinine Clearance: 46.9  mL/min (by C-G formula based on SCr of 0.9 mg/dL).  Liver Function Tests:  Recent Labs Lab 05/22/16 1155  AST 22  ALT 13*  ALKPHOS 63  BILITOT 0.5  PROT 7.4  ALBUMIN 3.8   No results for input(s): LIPASE, AMYLASE in the last 168 hours. No results for input(s): AMMONIA in the last 168 hours.  Coagulation Profile: No results for input(s): INR, PROTIME in the last 168 hours. No results for input(s): DDIMER in the last 72 hours.  Cardiac Enzymes: No results for input(s): CKTOTAL, CKMB, CKMBINDEX, TROPONINI in the last 168 hours.  BNP (last 3 results) No results for input(s): PROBNP in the last 8760 hours.  HbA1C: No results for input(s): HGBA1C in the last 72 hours. No results found for: HGBA1C   CBG: No results for input(s): GLUCAP in the last 168 hours.  Lipid Profile: No results for input(s): CHOL, HDL, LDLCALC, TRIG, CHOLHDL, LDLDIRECT in the last 72 hours.  Thyroid Function Tests: No results for input(s): TSH, T4TOTAL, FREET4, T3FREE, THYROIDAB in the last  72 hours.  Anemia Panel: No results for input(s): VITAMINB12, FOLATE, FERRITIN, TIBC, IRON, RETICCTPCT in the last 72 hours.  Urine analysis: No results found for: COLORURINE, APPEARANCEUR, LABSPEC, PHURINE, GLUCOSEU, HGBUR, BILIRUBINUR, KETONESUR, PROTEINUR, UROBILINOGEN, NITRITE, LEUKOCYTESUR  Sepsis Labs: '@LABRCNTIP'$ (procalcitonin:4,lacticidven:4) )No results found for this or any previous visit (from the past 240 hour(s)).       Radiological Exams on Admission: Dg Chest 2 View  Result Date: 05/22/2016 CLINICAL DATA:  Worsening shortness breath over the past 2 days. Known lung malignancy. No known trauma. EXAM: CHEST  2 VIEW COMPARISON:  PA and lateral chest x-ray dated December 29, 2015 FINDINGS: There is a new approximately 50% left-sided pneumothorax. There is no mediastinal shift. The right lung is well-expanded. The interstitial markings of both lungs are coarse. There may be a tiny amount of pleural fluid at the left lung base. The heart is small. The pulmonary vascularity is not engorged. There is wedge compression of T8 and T9 and T10 and T12 which is been previously demonstrated. IMPRESSION: New 50% left-sided pneumothorax. Underlying COPD and pulmonary fibrotic changes. These results were called by telephone at the time of interpretation on 05/22/2016 at 11:43 am to Dr. Waynetta Pean , who verbally acknowledged these results. Electronically Signed   By: David  Martinique M.D.   On: 05/22/2016 11:44   Dg Chest Port 1 View  Result Date: 05/22/2016 CLINICAL DATA:  Left-sided chest tube, history of squamous cell carcinoma of the left lung, also history of substance abuse EXAM: PORTABLE CHEST 1 VIEW COMPARISON:  Chest x-ray of 05/22/2016 FINDINGS: After insertion of the left chest tube only a tiny left apical pneumothorax is present of less than 5%. Chronic changes remain bilaterally. Asymmetric opacity in the left lung apex also stable. IMPRESSION: Only a tiny left apical pneumothorax  remains after insertion of left chest tube. Electronically Signed   By: Ivar Drape M.D.   On: 05/22/2016 13:35   Dg Chest 2 View  Result Date: 05/22/2016 CLINICAL DATA:  Worsening shortness breath over the past 2 days. Known lung malignancy. No known trauma. EXAM: CHEST  2 VIEW COMPARISON:  PA and lateral chest x-ray dated December 29, 2015 FINDINGS: There is a new approximately 50% left-sided pneumothorax. There is no mediastinal shift. The right lung is well-expanded. The interstitial markings of both lungs are coarse. There may be a tiny amount of pleural fluid at the left lung base. The heart is small.  The pulmonary vascularity is not engorged. There is wedge compression of T8 and T9 and T10 and T12 which is been previously demonstrated. IMPRESSION: New 50% left-sided pneumothorax. Underlying COPD and pulmonary fibrotic changes. These results were called by telephone at the time of interpretation on 05/22/2016 at 11:43 am to Dr. Waynetta Pean , who verbally acknowledged these results. Electronically Signed   By: David  Martinique M.D.   On: 05/22/2016 11:44   Dg Chest Port 1 View  Result Date: 05/22/2016 CLINICAL DATA:  Left-sided chest tube, history of squamous cell carcinoma of the left lung, also history of substance abuse EXAM: PORTABLE CHEST 1 VIEW COMPARISON:  Chest x-ray of 05/22/2016 FINDINGS: After insertion of the left chest tube only a tiny left apical pneumothorax is present of less than 5%. Chronic changes remain bilaterally. Asymmetric opacity in the left lung apex also stable. IMPRESSION: Only a tiny left apical pneumothorax remains after insertion of left chest tube. Electronically Signed   By: Ivar Drape M.D.   On: 05/22/2016 13:35      EKG: Independently reviewed   Assessment/Plan Principal Problem:  Spontaneous Pneumothorax on left  Status post chest tube placement EDP will notify pulmonary Repeat chest x-ray tomorrow, for comparison Oxygen, nebs, supportive therapy  Stage  III squamous cell Lung cancer Currently patient has refused all treatments He still a full code Will request palliative care consult in the morning for goals of care  Hypothyroidism Continue Synthroid, check TSH     Anxiety,  Depression Continue Xanax Does not appear to be suicidal or homicidal Could benefit from outpatient psychiatric evaluation prior to making a decision about not pursuing any further treatments for his lung cancer        DVT prophylaxis:  Lovenox  Code Status History   Full code  consults called: Pulmonary  Family Communication: Admission, patients condition and plan of care including tests being ordered have been discussed with the patient  who indicates understanding and agree with the plan and Code Status  Admission status: Observation  Disposition plan: Further plan will depend as patient's clinical course evolves and further radiologic and laboratory data become available. Likely home when stable     [facility] MD [facility] Pager [phone]  If 7PM-7AM, please contact night-coverage [email or web address] Password [email or web address]  05/22/2016, 1:58 PM

## 2016-05-22 NOTE — ED Provider Notes (Signed)
Medical screening examination/treatment/procedure(s) were conducted as a shared visit with non-physician practitioner(s) and myself.  I personally evaluated the patient during the encounter.   EKG Interpretation None      76 y.o. male presents with left side and chest pain. Has large spontaneous ptx on plain film. Percutaneous chest tube placement after discussion and consent. Admit for medical management and CT surgery evaluation.  CHEST TUBE INSERTION Date/Time: 05/22/2016 1:12 PM Performed by: Leo Grosser Authorized by: Leo Grosser   Consent:    Consent obtained:  Written   Consent given by:  Patient   Risks discussed:  Bleeding, damage to surrounding structures and pain   Alternatives discussed:  No treatment, delayed treatment and observation Pre-procedure details:    Skin preparation:  ChloraPrep   Preparation: Patient was prepped and draped in the usual sterile fashion   Anesthesia (see MAR for exact dosages):    Anesthesia method:  Local infiltration   Local anesthetic:  Lidocaine 1% w/o epi Procedure details:    Placement location:  L lateral   Scalpel size:  11   Tube size ([place]): 14 pigtail.   Dissection instrument: dilator, seldinger technique.   Ultrasound guidance: no     Tension pneumothorax: no     Drainage characteristics:  Air only   Suture material:  0 silk   Dressing:  Petrolatum-impregnated gauze Post-procedure details:    Post-insertion x-ray findings: tube in good position     Patient tolerance of procedure:  Tolerated well, no immediate complications   CRITICAL CARE Performed by: Leo Grosser Total critical care time: 30 minutes Critical care time was exclusive of separately billable procedures and treating other patients. Critical care was necessary to treat or prevent imminent or life-threatening deterioration. Critical care was time spent personally by me on the following activities: development of treatment plan with patient and/or  surrogate as well as nursing, discussions with consultants, evaluation of patient's response to treatment, examination of patient, obtaining history from patient or surrogate, ordering and performing treatments and interventions, ordering and review of laboratory studies, ordering and review of radiographic studies, pulse oximetry and re-evaluation of patient's condition.  See related encounter note    Leo Grosser, MD 05/22/16 [phone]

## 2016-05-22 NOTE — ED Notes (Signed)
ED Provider at bedside. 

## 2016-05-22 NOTE — ED Triage Notes (Signed)
Patient c/o left sided chest pain radiating to left arm worsening x2 days. States he was diagnosed with left sided lung cancer in November and has been receiving homeopathic treatment since that time.

## 2016-05-23 ENCOUNTER — Observation Stay (HOSPITAL_COMMUNITY): Payer: Medicare Other

## 2016-05-23 DIAGNOSIS — Z7189 Other specified counseling: Secondary | ICD-10-CM

## 2016-05-23 DIAGNOSIS — J449 Chronic obstructive pulmonary disease, unspecified: Secondary | ICD-10-CM | POA: Diagnosis present

## 2016-05-23 DIAGNOSIS — K861 Other chronic pancreatitis: Secondary | ICD-10-CM | POA: Diagnosis present

## 2016-05-23 DIAGNOSIS — J939 Pneumothorax, unspecified: Secondary | ICD-10-CM | POA: Diagnosis not present

## 2016-05-23 DIAGNOSIS — C3412 Malignant neoplasm of upper lobe, left bronchus or lung: Secondary | ICD-10-CM | POA: Diagnosis present

## 2016-05-23 DIAGNOSIS — J9383 Other pneumothorax: Secondary | ICD-10-CM | POA: Diagnosis present

## 2016-05-23 DIAGNOSIS — F419 Anxiety disorder, unspecified: Secondary | ICD-10-CM | POA: Diagnosis present

## 2016-05-23 DIAGNOSIS — Z515 Encounter for palliative care: Secondary | ICD-10-CM

## 2016-05-23 DIAGNOSIS — Z79899 Other long term (current) drug therapy: Secondary | ICD-10-CM | POA: Diagnosis not present

## 2016-05-23 DIAGNOSIS — C3492 Malignant neoplasm of unspecified part of left bronchus or lung: Secondary | ICD-10-CM | POA: Diagnosis not present

## 2016-05-23 DIAGNOSIS — Z809 Family history of malignant neoplasm, unspecified: Secondary | ICD-10-CM | POA: Diagnosis not present

## 2016-05-23 DIAGNOSIS — Z66 Do not resuscitate: Secondary | ICD-10-CM | POA: Diagnosis present

## 2016-05-23 DIAGNOSIS — Z7982 Long term (current) use of aspirin: Secondary | ICD-10-CM | POA: Diagnosis not present

## 2016-05-23 DIAGNOSIS — Z681 Body mass index (BMI) 19 or less, adult: Secondary | ICD-10-CM | POA: Diagnosis not present

## 2016-05-23 DIAGNOSIS — Z980 Intestinal bypass and anastomosis status: Secondary | ICD-10-CM | POA: Diagnosis not present

## 2016-05-23 DIAGNOSIS — M810 Age-related osteoporosis without current pathological fracture: Secondary | ICD-10-CM | POA: Diagnosis present

## 2016-05-23 DIAGNOSIS — E039 Hypothyroidism, unspecified: Secondary | ICD-10-CM | POA: Diagnosis present

## 2016-05-23 DIAGNOSIS — E43 Unspecified severe protein-calorie malnutrition: Secondary | ICD-10-CM | POA: Diagnosis present

## 2016-05-23 DIAGNOSIS — F319 Bipolar disorder, unspecified: Secondary | ICD-10-CM | POA: Diagnosis present

## 2016-05-23 DIAGNOSIS — Z885 Allergy status to narcotic agent status: Secondary | ICD-10-CM | POA: Diagnosis not present

## 2016-05-23 DIAGNOSIS — Z974 Presence of external hearing-aid: Secondary | ICD-10-CM | POA: Diagnosis not present

## 2016-05-23 DIAGNOSIS — R634 Abnormal weight loss: Secondary | ICD-10-CM

## 2016-05-23 DIAGNOSIS — Z8249 Family history of ischemic heart disease and other diseases of the circulatory system: Secondary | ICD-10-CM | POA: Diagnosis not present

## 2016-05-23 DIAGNOSIS — G8929 Other chronic pain: Secondary | ICD-10-CM | POA: Diagnosis present

## 2016-05-23 DIAGNOSIS — R64 Cachexia: Secondary | ICD-10-CM | POA: Diagnosis present

## 2016-05-23 DIAGNOSIS — H9190 Unspecified hearing loss, unspecified ear: Secondary | ICD-10-CM | POA: Diagnosis present

## 2016-05-23 DIAGNOSIS — F17210 Nicotine dependence, cigarettes, uncomplicated: Secondary | ICD-10-CM | POA: Diagnosis present

## 2016-05-23 LAB — COMPREHENSIVE METABOLIC PANEL
ALBUMIN: 3.2 g/dL — AB (ref 3.5–5.0)
ALK PHOS: 58 U/L (ref 38–126)
ALT: 10 U/L — AB (ref 17–63)
ANION GAP: 4 — AB (ref 5–15)
AST: 18 U/L (ref 15–41)
BUN: 22 mg/dL — AB (ref 6–20)
CALCIUM: 8.8 mg/dL — AB (ref 8.9–10.3)
CO2: 29 mmol/L (ref 22–32)
CREATININE: 0.95 mg/dL (ref 0.61–1.24)
Chloride: 104 mmol/L (ref 101–111)
GFR calc Af Amer: >60 mL/min (ref >60)
GFR calc non Af Amer: >60 mL/min (ref >60)
GLUCOSE: 103 mg/dL — AB (ref 65–99)
Potassium: 4 mmol/L (ref 3.5–5.1)
SODIUM: 137 mmol/L (ref 135–145)
Total Bilirubin: 0.5 mg/dL (ref 0.3–1.2)
Total Protein: 6.2 g/dL — ABNORMAL LOW (ref 6.5–8.1)

## 2016-05-23 LAB — CBC
HEMATOCRIT: 35.4 % — AB (ref 39.0–52.0)
Hemoglobin: 11.6 g/dL — ABNORMAL LOW (ref 13.0–17.0)
MCH: 30.5 pg (ref 26.0–34.0)
MCHC: 32.8 g/dL (ref 30.0–36.0)
MCV: 93.2 fL (ref 78.0–100.0)
Platelets: 131 10*3/uL — ABNORMAL LOW (ref 150–400)
RBC: 3.8 MIL/uL — AB (ref 4.22–5.81)
RDW: 13.3 % (ref 11.5–15.5)
WBC: 5.5 10*3/uL (ref 4.0–10.5)

## 2016-05-23 MED ORDER — ADULT MULTIVITAMIN W/MINERALS CH
1.0000 | ORAL_TABLET | Freq: Every day | ORAL | Status: DC
  Administered 2016-05-23 – 2016-05-25 (×3): 1 via ORAL
  Filled 2016-05-23 (×3): qty 1

## 2016-05-23 MED ORDER — DEXAMETHASONE 2 MG PO TABS: 2.0000 mg | ORAL_TABLET | Freq: Every day | ORAL | Status: DC

## 2016-05-23 MED ORDER — DEXAMETHASONE 4 MG PO TABS
4.0000 mg | ORAL_TABLET | Freq: Every day | ORAL | Status: AC
  Administered 2016-05-23 – 2016-05-25 (×3): 4 mg via ORAL
  Filled 2016-05-23 (×3): qty 1

## 2016-05-23 MED ORDER — DEXAMETHASONE 0.5 MG PO TABS: 1.0000 mg | ORAL_TABLET | Freq: Every day | ORAL | Status: DC

## 2016-05-23 MED ORDER — BOOST / RESOURCE BREEZE PO LIQD
1.0000 | Freq: Three times a day (TID) | ORAL | Status: DC
  Administered 2016-05-23: 1 via ORAL

## 2016-05-23 MED ORDER — DEXAMETHASONE 4 MG PO TABS: 4.0000 mg | ORAL_TABLET | Freq: Every day | ORAL | Status: DC

## 2016-05-23 MED ORDER — ENSURE ENLIVE PO LIQD
237.0000 mL | ORAL | Status: DC
  Administered 2016-05-24: 237 mL via ORAL

## 2016-05-23 NOTE — Progress Notes (Signed)
[facility]  S: feels better than yesterday, some soreness at chest tube site.  O: Vitals:   05/22/16 1415 05/22/16 1545 05/22/16 2210 05/23/16 0603  BP:  (!) 172/80 (!) 104/49 (!) 124/55  Pulse: 74 82 62 (!) 58  Resp: '16 20 18 18  '$ Temp:  97.6 F (36.4 C) 97.8 F (36.6 C) 97.8 F (36.6 C)  TempSrc:  Oral Oral Oral  SpO2: 96% 95% 94% 97%  Weight:  96 lb (43.5 kg)    Height:  '5\' 6"'$  (1.676 m)     RA  Gen: cachectic male, no distress HENT: OP clear, TM's clear, neck supple PULM: poor air movement, trace wheezing CV: RRR, no mgr, trace edema GI: BS+, soft, nontender Derm: no cyanosis or rash Psyche: normal mood and affect  CXR images independently reviewed> small apical pneumothorax LUL, pigtail in place   Impression/plan:  Pneumothorax, spontaneous> the tube today has poor respiratory variation yet it flushed OK with saline.  Rather than give talc pleuridesis, would prefer to change to water seal and then consider removal in AM.    Rest as per [facility]  Roselie Awkward, MD [place] [facility] Pager: [phone] Cell: [phone] After 3pm or if no response, call [phone]

## 2016-05-23 NOTE — Progress Notes (Signed)
Output for chest tube was 51m

## 2016-05-23 NOTE — Progress Notes (Signed)
Chest tube put out about 24m overnight.

## 2016-05-23 NOTE — Consult Note (Signed)
Consultation Note Date: 05/23/2016   Patient Name: Miguel Orr  DOB: Oct 11, 1940  MRN: [id number]  Age / Sex: 76 y.o., male  PCP: Kathyrn Lass, MD Referring Physician: Nita Sells, MD  Reason for Consultation: Establishing goals of care given stage 3 lung cancer with pnuemothorax and cachexia  HPI/Patient Profile: 76 y.o. male  with past medical history of ischemic bowel, vertebral fractures, and lung cancer (dx 01/2016) who was admitted on 05/22/2016 with a left sided pneumothorax and weight loss.  He is being treated with a chest tube and seems to be recovering well.  PMT was consulted for goals of care.  Mr. Otting is a little unusual as he has decided to use only homeopathic treatments for his cancer.   Clinical Assessment and Goals of Care:  I have reviewed medical records including EPIC notes, labs, and imaging, received report from Dr. Verlon Au and the bedside RN, and then met at the bedside with Mr. Schipani to discuss diagnosis prognosis, GOC, EOL wishes, disposition and options.  I introduced Palliative Medicine as specialized medical care for people living with serious illness. It focuses on providing relief from the symptoms and stress of a serious illness. The goal is to improve quality of life for both the patient and the family.  We discussed a brief life review of the patient. 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 He developed ischemic bowel and was hospitalized for over 90 days.  At that point he was forced to retire.  He has been married for 91 years to his wife who still works at [employer].  They have a large extended family including children, grand children and great grand children.  He lives for his family.  He and his wife are in the process of selling their house and moving to [place] to live near his son and 6 grand children.  Then I assessed  functional and nutritional status at home by gathering history. Mr. Deneault has not been up and walking for at least 3 weeks due to severe pain from vertebral fractures.  He does not eat well at home.  His weight when he was "normal" was 180 - 190.  He now weighs less than 100 lbs.    I attempted to elicit values and goals of care important to the patient.  He tells me that if he is unable to enjoy conversation with his family or a good meal he would rather be let go peacefully  I encouraged him to let his family know his feelings and his "bottom line" about when to be "let go".  He felt as though he was dying when he developed chest pain from the pneumothorax - and he would have been ok with that.  He is comfortable that he is going to die when it is his time.  He clearly understands that the cancer will take his life at some point.  We discussed chemo / rad treatment.  He states he is not changing his  mind about utilizing only homeopathic treatments.    We discussed code status.  After our discussion he elected to be a DNR.  We discussed hospice benefits that he could take advantage of in [place] after his move when he is ready.  Questions and concerns were addressed.  Hard Choices booklet left for review. PMT will follow up tomorrow to provide support to the Bromide family and answer any questions.   Primary Decision Maker:  PATIENT    SUMMARY OF RECOMMENDATIONS    Still no chemo or radiation.  Patient looking forward to DC  Code Status/Advance Care Planning:  DNR    Symptom Management:   Recommend decadron taper for back pain and appetite.  Heating pad for comfort.  Protein supplements for nutrition (ensure/ boost)  Palliative Prophylaxis:   Frequent Pain Assessment  Psycho-social/Spiritual:   Desire for further Chaplaincy support: no   Prognosis:   < 6 months given severe weight loss, minimal mobility, and lung cancer with out chemo/rad  Discharge Planning: Home with Home  Health      Primary Diagnoses: Present on Admission: . Depression . Anxiety . Stage III squamous cell carcinoma of left lung ([place])   I have reviewed the medical record, interviewed the patient and family, and examined the patient. The following aspects are pertinent.  Past Medical History:  Diagnosis Date  . Anxiety   . Chronic back pain   . Chronic generalized abdominal pain   . Chronic pancreatitis ([place])   . Depression   . Hearing loss    HAS HEARING AIDES  . History of vertebral compression fracture    T8, 10, 11, 12  . 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 Mass of upper lobe of left lung    CXR 12/29/15, CT CHEST 1019/17, PET 01/23/16  . Osteoporosis   . Stage III squamous cell carcinoma of left lung ([place]) 02/24/2016  . Substance abuse    TOBACCO   Social History   Social History  . Marital status: Married    Spouse name: N/A  . Number of children: N/A  . Years of education: N/A   Social History Main Topics  . Smoking status: Current Every Day Smoker    Packs/day: 1.00    Years: 60.00    Types: Cigarettes  . Smokeless tobacco: Never Used  . Alcohol use No  . Drug use: Unknown  . Sexual activity: Not Asked   Other Topics Concern  . None   Social History Narrative  . None   Family History  Problem Relation Age of Onset  . Hyperlipidemia Mother   . Pneumonia Father   . Alzheimer's disease Sister   . Pneumonia Brother   . Cancer Brother     BONE MARROW  . Hypertension Brother    Scheduled Meds: . enoxaparin (LOVENOX) injection  30 mg Subcutaneous Q24H  . levothyroxine  200 mcg Oral QAC breakfast   Continuous Infusions: . sodium chloride 50 mL/hr at 05/22/16 1555   PRN Meds:.acetaminophen **OR** acetaminophen, ALPRAZolam, HYDROcodone-acetaminophen, levalbuterol, ondansetron **OR** ondansetron (ZOFRAN) IV, oxyCODONE, polyethylene glycol Allergies  Allergen Reactions  . Dilaudid [Hydromorphone Hcl] Other (See Comments)    Reaction: Hallucinations     Review of Systems back pain, poor appetite, unable to walk  Physical Exam  Well developed, frail, cachectic man. A&O with clear speech and thought content No apparent distress.  Vital Signs: BP (!) 124/55   Pulse (!) 58   Temp 97.8 F (36.6 C) (Oral)  Resp 18   Ht 5' 6"  (1.676 m)   Wt 43.5 kg (96 lb)   SpO2 97%   BMI 15.49 kg/m  Pain Assessment: 0-10   Pain Score: 8    SpO2: SpO2: 97 % O2 Device:SpO2: 97 % O2 Flow Rate: .O2 Flow Rate (L/min): 3 L/min  IO: Intake/output summary:   Intake/Output Summary (Last 24 hours) at 05/23/16 1048 Last data filed at 05/23/16 1000  Gross per 24 hour  Intake          1624.17 ml  Output              350 ml  Net          1274.17 ml    LBM: Last BM Date: 05/21/16 Baseline Weight: Weight: 45.4 kg (100 lb) Most recent weight: Weight: 43.5 kg (96 lb)     Palliative Assessment/Data:   Flowsheet Rows   Flowsheet Row Most Recent Value  Intake Tab  Referral Department  Hospitalist  Unit at Time of Referral  Med/Surg Unit  Palliative Care Primary Diagnosis  Cancer  Date Notified  05/22/16  Palliative Care Type  New Palliative care  Reason for referral  Clarify Goals of Care  Date of Admission  05/22/16  Date first seen by Palliative Care  05/23/16  # of days Palliative referral response time  1 Day(s)  # of days IP prior to Palliative referral  0  Clinical Assessment  Pain Max last 24 hours  6  Psychosocial & Spiritual Assessment  Palliative Care Outcomes  Patient/Family meeting held?  Yes  Who was at the meeting?  patient  Palliative Care Outcomes  Improved pain interventions, Clarified goals of care, Changed CPR status  Patient/Family wishes: Interventions discontinued/not started   Mechanical Ventilation       Time Total: 70 min. Greater than 50%  of this time was spent counseling and coordinating care related to the above assessment and plan.  Signed by: Imogene Burn, PA-C Palliative Medicine Pager:  [phone]  Please contact Palliative Medicine Team phone at [phone] for questions and concerns.  For individual provider: See Shea Evans

## 2016-05-23 NOTE — Progress Notes (Signed)
Pigtail cath flushed with 30cc sterile NS without difficulty.

## 2016-05-23 NOTE — Progress Notes (Signed)
Initial Nutrition Assessment  DOCUMENTATION CODES:   Severe malnutrition in context of chronic illness, Underweight  INTERVENTION:  - Will order Ensure Enlive once/day, this supplement provides 350 kcal and 20 grams of protein - Will order daily multivitamin with minerals.  - Continue to encourage PO intakes of meals, beverages, and supplement.  NUTRITION DIAGNOSIS:   Malnutrition related to chronic illness as evidenced by severe depletion of muscle mass, severe depletion of body fat.  GOAL:   Patient will meet greater than or equal to 90% of their needs  MONITOR:   PO intake, Supplement acceptance, Weight trends, Labs, I & O's  REASON FOR ASSESSMENT:   Other (Comment) (Underweight BMI)  ASSESSMENT:   76 year old male with a history of recent diagnosis of history significant for chronic pancreatitis, hypothyroidism, chronic back pain, hearing loss, anxiety/depression and long history of smoking., lung cancer, in November 2017, consistent with squamous cell carcinoma, diagnosed with a CT-guided lung biopsy, previously evaluated by radiation oncology, Dr. Sondra Come and Dr. Julien Nordmann in November 2017, but never started on treatment, because of patient refusal, who comes to the ER today with left-sided chest pain radiating to the left arm, associated with shortness of breath. Patient has declined radiation therapy and chemotherapy, states that "I don't want to poison my body", and is currently on homeopathic treatment for his lung cancer. Patient appears to be Chronically ill appearing, admits to have lost a lot of weight but  he cannot quantify, how much weight he has lost  Pt seen d/t underweight BMI. Per chart review, pt ate 100% of breakfast which he reports was scrambled eggs and sausage. Pt did not eat lunch today and states that he has not eaten lunch "for years." He usually snacks at home rather than eating large meals although for breakfast he will often eat a sausage and egg sandwich  or oatmeal. Recently he has been having back pain and inability to stand to cook so he has been eating more foods that do not require much preparation or time to cook. He was not drinking any oral nutrition supplements PTA but has tried [employer] today and did not care for this supplement; he is not interested in trying a different flavor of this supplement but is agreeable to trying Ensure. He has several snacks (Dr. Malachi Bonds, crackers, and grapes) at bedside.   Pt denies any nausea PTA and states that he was having intermittent abdominal pain which he associates with constipation as constipation is a usual for him. He denies this sensation being any worse with eating or drinking. He denies any chewing or swallowing issues. He denies any other side effects that have affected eating while utilizing homeopathic treatment for cancer.   Physical assessment indicates severe muscle and fat wasting throughout with some areas of moderate depletion. No edema at this time. Pt reports he used to weigh 170-180 lbs about 10 years ago and has steadily been losing weight. He reports that when he was dx with lung cancer in November he weighed ~110 lbs. Based on CBW, this indicates 14 lb weight loss (12.7% body weight) in the past 3 months which is significant for time frame.  Medications reviewed; 200 mcg oral Synthroid/day. Labs reviewed; BUN: 22 mg/dL, Ca: 8.8 mg/dL.  IVF: NS @ 50 mL/hr.    Diet Order:  Diet regular Room service appropriate? Yes; Fluid consistency: Thin  Skin:  Reviewed, no issues  Last BM:  2/27  Height:   Ht Readings from Last 1 Encounters:  05/22/16 '5\' 6"'$  (1.676 m)    Weight:   Wt Readings from Last 1 Encounters:  05/22/16 96 lb (43.5 kg)    Ideal Body Weight:  64.54 kg  BMI:  Body mass index is 15.49 kg/m.  Estimated Nutritional Needs:   Kcal:  1525-1740 (35-40 kcal/kg)  Protein:  65-78 grams (1.5-1.8 grams/kg)  Fluid:  >/= 1.6 L/day  EDUCATION NEEDS:   No  education needs identified at this time    Jarome Matin, MS, RD, LDN, CNSC Inpatient Clinical Dietitian Pager # [phone] After hours/weekend pager # [phone]

## 2016-05-23 NOTE — Progress Notes (Signed)
PROGRESS NOTE    Miguel Orr  GEZ:[id number] DOB: Aug 18, 1940 DOA: 05/22/2016 PCP: [name], MD  Outpatient Specialists:   Oncology Dr. Earlie Server Radiation oncology Dr Sondra Come   Brief Narrative:  76 y/o ? 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 Pulm considering water-seal and discussion about d/c tube am. Chest x-ray repeated 05/23/2016 seems stable-- for pain continue OxyIR 5 mg every 4 when necessary moderate pain and when necessary Norco if needed received some Toradol and Dilaudid overnight-palliative care has been consulted to see the patient stage III squamous cell CA -on herbal supplements DNAR now after discussion with palliative probably need Hospice to follow as OP--moving to [place] soon Mild prerenal acidemia-BUN/creatinine slightly up from admission 22/0.9-continue saline 50 cc per  hour--labs am Severe emphysema and heavy smoking history-monitor, continue levalbuterol 0.60 every 6 when necessary 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 of hyperthyroid state -continue Synthroid 200 mcg daily-TSH 0.030--would adjust when more clinically stable and downard titrate as OP Bipolar-continue Xanax 0.25   DVT prophylaxis: Lovenox Code Status: DNAR Family Communication: no family + Disposition Plan: unclear-await chest tube removal and discussion prior to decisions   Consultants:   Pulmonology  Procedures:   Pigtail catheter placement 05/22/2016 in the emergency room  Antimicrobials:   None currently    Subjective:  Afebrile No fever no chills Has some sorness in bottom Asking to some relif from this Also shares has some fractures in his back  Objective: Vitals:   05/22/16 1415 05/22/16 1545 05/22/16 2210 05/23/16 0603  BP:  (!) 172/80 (!) 104/49 (!) 124/55  Pulse: 74 82 62 (!) 58  Resp: '16 20 18 18  '$ Temp:  97.6 F (36.4 C) 97.8 F (36.6 C) 97.8 F (36.6 C)  TempSrc:  Oral Oral Oral  SpO2: 96% 95% 94% 97%  Weight:  43.5 kg (96 lb)    Height:  '5\' 6"'$  (1.676 m)      Intake/Output Summary (Last 24 hours) at 05/23/16 0811 Last data filed at 05/23/16 0807  Gross per 24 hour  Intake          1474.17 ml  Output              350 ml  Net          1124.17 ml   Filed Weights   05/22/16 1111 05/22/16 1545  Weight: 45.4 kg (100 lb) 43.5 kg (96 lb)    Examination:  General exam: Appears calm and comfortable  Respiratory  system: Clear to auscultation. Respiratory effort normal. No added sound Cardiovascular system: S1 & S2 heard, RRR. No JVD, murmurs, rubs, gallops or clicks. No pedal edema. Gastrointestinal system: Abdomen is nondistended, soft Central nervous system: Alert and oriented. No focal neurological deficits. Extremities: Symmetric 5 x 5 power. Skin: No rashes, lesions or ulcers Psychiatry: Judgement and insight appear normal. Mood &  affect appropriate.     Data Reviewed: I have personally reviewed following labs and imaging studies  CBC:  Recent Labs Lab 05/22/16 1155 05/23/16 0522  WBC 7.6 5.5  NEUTROABS 5.7  --   HGB 13.4 11.6*  HCT 40.1 35.4*  MCV 92.8 93.2  PLT 144* 619*   Basic Metabolic Panel:  Recent Labs Lab 05/22/16 1155 05/23/16 0522  NA 136 137  K 4.2 4.0  CL 101 104  CO2 27 29  GLUCOSE 111* 103*  BUN 19 22*  CREATININE 0.90 0.95  CALCIUM 9.4 8.8*   GFR: Estimated Creatinine Clearance: 42.6 mL/min (by C-G formula based on SCr of 0.95 mg/dL). Liver Function Tests:  Recent Labs Lab 05/22/16 1155 05/23/16 0522  AST 22 18  ALT 13* 10*  ALKPHOS 63 58  BILITOT 0.5 0.5  PROT 7.4 6.2*  ALBUMIN 3.8 3.2*   No results for input(s): LIPASE, AMYLASE in the last 168 hours. No results for input(s): AMMONIA in the last 168 hours. Coagulation Profile: No results for input(s): INR, PROTIME in the last 168 hours. Cardiac Enzymes: No results for input(s): CKTOTAL, CKMB, CKMBINDEX, TROPONINI in the last 168 hours. BNP (last 3 results) No results for input(s): PROBNP in the last 8760 hours. HbA1C: No results for input(s): HGBA1C in the last 72 hours. CBG: No results for input(s): GLUCAP in the last 168 hours. Lipid Profile: No results for input(s): CHOL, HDL, LDLCALC, TRIG, CHOLHDL, LDLDIRECT in the last 72 hours. Thyroid Function Tests:  Recent Labs  05/22/16 1155  TSH 0.030*   Anemia Panel: No results for input(s): VITAMINB12, FOLATE, FERRITIN, TIBC, IRON, RETICCTPCT in the last 72 hours. Urine analysis:    Component Value Date/Time   COLORURINE YELLOW 05/22/2016 2133   APPEARANCEUR CLEAR 05/22/2016 2133   LABSPEC 1.027 05/22/2016 2133   PHURINE 5.0 05/22/2016 2133   GLUCOSEU NEGATIVE 05/22/2016 2133   HGBUR NEGATIVE 05/22/2016 2133   BILIRUBINUR NEGATIVE 05/22/2016 2133   KETONESUR NEGATIVE 05/22/2016 2133   PROTEINUR NEGATIVE 05/22/2016 2133   NITRITE NEGATIVE  05/22/2016 2133   LEUKOCYTESUR NEGATIVE 05/22/2016 2133   Sepsis Labs: '@LABRCNTIP'$ (procalcitonin:4,lacticidven:4)  )No results found for this or any previous visit (from the past 240 hour(s)).       Radiology Studies: Dg Chest 2 View  Result Date: 05/22/2016 CLINICAL DATA:  Worsening shortness breath over the past 2 days. Known lung malignancy. No known trauma. EXAM: CHEST  2 VIEW COMPARISON:  PA and lateral chest x-ray dated December 29, 2015 FINDINGS: There is a new approximately 50% left-sided pneumothorax. There is no mediastinal shift. The right lung is well-expanded. The interstitial markings of both lungs are coarse. There may be a tiny amount of pleural fluid at the left lung base. The heart is small. The pulmonary vascularity is not engorged. There is wedge compression of T8 and T9 and T10 and T12 which is been previously demonstrated. IMPRESSION: New 50% left-sided pneumothorax. Underlying COPD and pulmonary fibrotic changes. These results were called by telephone at the time of interpretation on 05/22/2016 at 11:43 am to Dr. Waynetta Pean , who verbally acknowledged these results. Electronically Signed  By: David  Martinique M.D.   On: 05/22/2016 11:44   Dg Chest Port 1 View  Result Date: 05/23/2016 CLINICAL DATA:  Pneumothorax. EXAM: PORTABLE CHEST 1 VIEW COMPARISON:  05/22/2016 .  12/29/2015.  PET-CT 01/23/2016. FINDINGS: Mediastinum and hilar structures are normal. Heart size stable. Left chest tube in stable position. Stable tiny left apical pneumothorax. Persistent opacity in the left apex. Stable biapical pleural thickening. IMPRESSION: 1. Left chest tube in stable position. Stable tiny left apical pneumothorax. 2. Stable tiny left apical pneumothorax. Electronically Signed   By: Marcello Moores  Register   On: 05/23/2016 06:36   Dg Chest Port 1 View  Result Date: 05/22/2016 CLINICAL DATA:  Left-sided chest tube, history of squamous cell carcinoma of the left lung, also history of substance  abuse EXAM: PORTABLE CHEST 1 VIEW COMPARISON:  Chest x-ray of 05/22/2016 FINDINGS: After insertion of the left chest tube only a tiny left apical pneumothorax is present of less than 5%. Chronic changes remain bilaterally. Asymmetric opacity in the left lung apex also stable. IMPRESSION: Only a tiny left apical pneumothorax remains after insertion of left chest tube. Electronically Signed   By: Ivar Drape M.D.   On: 05/22/2016 13:35        Scheduled Meds: . enoxaparin (LOVENOX) injection  30 mg Subcutaneous Q24H  . levothyroxine  200 mcg Oral QAC breakfast   Continuous Infusions: . sodium chloride 50 mL/hr at 05/22/16 1555     LOS: 0 days    Time spent: [place], MD [facility] ([phone]   If 7PM-7AM, please contact night-coverage [email or web address] Password [email or web address] 05/23/2016, 8:11 AM

## 2016-05-24 ENCOUNTER — Inpatient Hospital Stay (HOSPITAL_COMMUNITY): Payer: Medicare Other

## 2016-05-24 DIAGNOSIS — J939 Pneumothorax, unspecified: Secondary | ICD-10-CM

## 2016-05-24 DIAGNOSIS — E43 Unspecified severe protein-calorie malnutrition: Secondary | ICD-10-CM | POA: Insufficient documentation

## 2016-05-24 LAB — COMPREHENSIVE METABOLIC PANEL
ALK PHOS: 54 U/L (ref 38–126)
ALT: 11 U/L — ABNORMAL LOW (ref 17–63)
ANION GAP: 5 (ref 5–15)
AST: 19 U/L (ref 15–41)
Albumin: 3.2 g/dL — ABNORMAL LOW (ref 3.5–5.0)
BUN: 14 mg/dL (ref 6–20)
CALCIUM: 9 mg/dL (ref 8.9–10.3)
CHLORIDE: 106 mmol/L (ref 101–111)
CO2: 26 mmol/L (ref 22–32)
Creatinine, Ser: 0.73 mg/dL (ref 0.61–1.24)
GFR calc Af Amer: >60 mL/min (ref >60)
GFR calc non Af Amer: >60 mL/min (ref >60)
GLUCOSE: 108 mg/dL — AB (ref 65–99)
POTASSIUM: 3.9 mmol/L (ref 3.5–5.1)
SODIUM: 137 mmol/L (ref 135–145)
Total Bilirubin: 0.4 mg/dL (ref 0.3–1.2)
Total Protein: 6.5 g/dL (ref 6.5–8.1)

## 2016-05-24 LAB — CBC
HCT: 35 % — ABNORMAL LOW (ref 39.0–52.0)
HEMOGLOBIN: 11.8 g/dL — AB (ref 13.0–17.0)
MCH: 31.1 pg (ref 26.0–34.0)
MCHC: 33.7 g/dL (ref 30.0–36.0)
MCV: 92.1 fL (ref 78.0–100.0)
Platelets: 134 10*3/uL — ABNORMAL LOW (ref 150–400)
RBC: 3.8 MIL/uL — ABNORMAL LOW (ref 4.22–5.81)
RDW: 13.1 % (ref 11.5–15.5)
WBC: 5.6 10*3/uL (ref 4.0–10.5)

## 2016-05-24 NOTE — Progress Notes (Signed)
S: Awake and alert. No resp distress. Tolerated CT to h20 seal.   O: BP 130/66 (BP Location: Left Arm)   Pulse (!) 56   Temp 97.5 F (36.4 C) (Oral)   Resp 18   Ht '5\' 6"'$  (1.676 m)   Wt 96 lb (43.5 kg)   SpO2 97%   BMI 15.49 kg/m   Recent Labs Lab 05/22/16 1155 05/23/16 0522 05/24/16 0603  NA 136 137 137  K 4.2 4.0 3.9  CL 101 104 106  CO2 '27 29 26  '$ BUN 19 22* 14  CREATININE 0.90 0.95 0.73  GLUCOSE 111* 103* 108*    Recent Labs Lab 05/22/16 1155 05/23/16 0522 05/24/16 0603  HGB 13.4 11.6* 11.8*  HCT 40.1 35.4* 35.0*  WBC 7.6 5.5 5.6  PLT 144* 131* 134*    Dg Chest 2 View  Result Date: 05/22/2016 CLINICAL DATA:  Worsening shortness breath over the past 2 days. Known lung malignancy. No known trauma. EXAM: CHEST  2 VIEW COMPARISON:  PA and lateral chest x-ray dated December 29, 2015 FINDINGS: There is a new approximately 50% left-sided pneumothorax. There is no mediastinal shift. The right lung is well-expanded. The interstitial markings of both lungs are coarse. There may be a tiny amount of pleural fluid at the left lung base. The heart is small. The pulmonary vascularity is not engorged. There is wedge compression of T8 and T9 and T10 and T12 which is been previously demonstrated. IMPRESSION: New 50% left-sided pneumothorax. Underlying COPD and pulmonary fibrotic changes. These results were called by telephone at the time of interpretation on 05/22/2016 at 11:43 am to Dr. Waynetta Pean , who verbally acknowledged these results. Electronically Signed   By: David  Martinique M.D.   On: 05/22/2016 11:44   Dg Chest Port 1 View  Result Date: 05/24/2016 CLINICAL DATA:  Chest tube. EXAM: PORTABLE CHEST 1 VIEW COMPARISON:  05/23/2016 . FINDINGS: Left chest tube in stable position. Miniscule left apical pneumothorax, improved from prior exam . Lungs are clear. No pleural effusion. Heart size normal. No acute bony abnormality. IMPRESSION: Left chest tube in stable position. Miniscule  left apical pneumothorax, improved from prior exam . Electronically Signed   By: [place]   On: 05/24/2016 09:01   Dg Chest Port 1 View  Result Date: 05/23/2016 CLINICAL DATA:  History of pneumothorax, followup EXAM: PORTABLE CHEST 1 VIEW COMPARISON:  Chest x-ray of 05/23/2016 FINDINGS: There is only a very small left apical pneumothorax remaining, slightly diminished compared to the most recent chest x-ray. Left chest tube remains. Right lung is clear. Heart size is stable. IMPRESSION: Further decrease in tiny left apical pneumothorax. Left chest tube remains. Electronically Signed   By: Ivar Drape M.D.   On: 05/23/2016 12:44   Dg Chest Port 1 View  Result Date: 05/23/2016 CLINICAL DATA:  Pneumothorax. EXAM: PORTABLE CHEST 1 VIEW COMPARISON:  05/22/2016 .  12/29/2015.  PET-CT 01/23/2016. FINDINGS: Mediastinum and hilar structures are normal. Heart size stable. Left chest tube in stable position. Stable tiny left apical pneumothorax. Persistent opacity in the left apex. Stable biapical pleural thickening. IMPRESSION: 1. Left chest tube in stable position. Stable tiny left apical pneumothorax. 2. Stable tiny left apical pneumothorax. Electronically Signed   By: Marcello Moores  Register   On: 05/23/2016 06:36   Dg Chest Port 1 View  Result Date: 05/22/2016 CLINICAL DATA:  Left-sided chest tube, history of squamous cell carcinoma of the left lung, also history of substance abuse EXAM: PORTABLE CHEST 1  VIEW COMPARISON:  Chest x-ray of 05/22/2016 FINDINGS: After insertion of the left chest tube only a tiny left apical pneumothorax is present of less than 5%. Chronic changes remain bilaterally. Asymmetric opacity in the left lung apex also stable. IMPRESSION: Only a tiny left apical pneumothorax remains after insertion of left chest tube. Electronically Signed   By: Ivar Drape M.D.   On: 05/22/2016 13:35   3/2 Left pnx nsc per c x r  Awake and alert No JVD/LAN Decrease air movement. No air leak from from  left pig tail catheter HSR RRR Abd soft + bs Neuro: Intact.  I/P Left spontaneous pnx. [place] in sliver of pnx on cxr.   Dc left pigtail catheter.  Follow up cxr '@1500'$  and in am  Note he wants to go home.   Richardson Landry Minor ACNP Maryanna Shape PCCM Pager [phone] till 3 pm If no answer page [phone] 05/24/2016, 9:33 AM    ATTENDING NOTE / ATTESTATION NOTE :   I have discussed the case with the resident/APP  Richardson Landry Minor NP  I agree with the resident/APP's  history, physical examination, assessment, and plans.    I have edited the above note and modified it according to our agreed history, physical examination, assessment and plan.   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  Pt has significantly improved with chest tube.  CT has been on water seal last 24 hrs.  CXR with tiny L PTX. He wants to go home.   Vitals:  Vitals:   05/23/16 0603 05/23/16 1314 05/23/16 2144 05/24/16 0613  BP: (!) 124/55 (!) 130/58 132/64 130/66  Pulse: (!) 58 66 65 (!) 56  Resp: '18  17 18  '$ Temp: 97.8 F (36.6 C) 97.9 F (36.6 C) 97.6 F (36.4 C) 97.5 F (36.4 C)  TempSrc: Oral Oral Oral Oral  SpO2: 97% 94% 97% 97%  Weight:      Height:        Constitutional/General: chronically ill, not in any distress. Comfortable. Cachectic.   Body mass index is 15.49 kg/m. Wt Readings from Last 3 Encounters:  05/22/16 43.5 kg (96 lb)  02/22/16 51.5 kg (113 lb 9.6 oz)  02/20/16 54.4 kg (120 lb)    HEENT: PERLA, anicteric sclerae. (-) Oral thrush.  Neck: No masses. Midline trachea. No JVD, (-) LAD. (-) bruits appreciated.  Respiratory/Chest: Grossly normal chest. (-) deformity. (-) Accessory muscle use.  Symmetric expansion. Diminished BS on both lower lung  zones. (-) wheezing, crackles, rhonchi (-) egophony L chest tube to water seal. (-) air leak.   Cardiovascular: Regular rate and  rhythm, heart sounds normal, no murmur or gallops,  (-)  peripheral edema  Gastrointestinal:  Normal bowel sounds. Soft, non-tender. No hepatosplenomegaly.  (-) masses.   Musculoskeletal:  Normal muscle tone.   Extremities: Grossly normal. (-) clubbing, cyanosis.  (-) edema  Skin: (-) rash,lesions seen.   Neurological/Psychiatric : sedated, intubated. CN grossly intact. (-) lateralizing signs.     CBC Recent Labs     05/22/16  1155  05/23/16  0522  05/24/16  0603  WBC  7.6  5.5  5.6  HGB  13.4  11.6*  11.8*  HCT  40.1  35.4*  35.0*  PLT  144*  131*  134*  Coag's No results for input(s): APTT, INR in the last 72 hours.  BMET Recent Labs     05/22/16  1155  05/23/16  0522  05/24/16  0603  NA  136  137  137  K  4.2  4.0  3.9  CL  101  104  106  CO2  '27  29  26  '$ BUN  19  22*  14  CREATININE  0.90  0.95  0.73  GLUCOSE  111*  103*  108*    Electrolytes Recent Labs     05/22/16  1155  05/23/16  0522  05/24/16  0603  CALCIUM  9.4  8.8*  9.0    Sepsis Markers No results for input(s): PROCALCITON, O2SATVEN in the last 72 hours.  Invalid input(s): LACTICACIDVEN  ABG No results for input(s): PHART, PCO2ART, PO2ART in the last 72 hours.  Liver Enzymes Recent Labs     05/22/16  1155  05/23/16  0522  05/24/16  0603  AST  '22  18  19  '$ ALT  13*  10*  11*  ALKPHOS  63  58  54  BILITOT  0.5  0.5  0.4  ALBUMIN  3.8  3.2*  3.2*    Cardiac Enzymes No results for input(s): TROPONINI, PROBNP in the last 72 hours.  Glucose No results for input(s): GLUCAP in the last 72 hours.  Imaging Dg Chest 2 View  Result Date: 05/22/2016 CLINICAL DATA:  Worsening shortness breath over the past 2 days. Known lung malignancy. No known trauma. EXAM: CHEST  2 VIEW COMPARISON:  PA and lateral chest x-ray dated December 29, 2015 FINDINGS: There  is a new approximately 50% left-sided pneumothorax. There is no mediastinal shift. The right lung is well-expanded. The interstitial markings of both lungs are coarse. There may be a tiny amount of pleural fluid at the left lung base. The heart is small. The pulmonary vascularity is not engorged. There is wedge compression of T8 and T9 and T10 and T12 which is been previously demonstrated. IMPRESSION: New 50% left-sided pneumothorax. Underlying COPD and pulmonary fibrotic changes. These results were called by telephone at the time of interpretation on 05/22/2016 at 11:43 am to Dr. Waynetta Pean , who verbally acknowledged these results. Electronically Signed   By: David  Martinique M.D.   On: 05/22/2016 11:44   Dg Chest Port 1 View  Result Date: 05/24/2016 CLINICAL DATA:  Chest tube. EXAM: PORTABLE CHEST 1 VIEW COMPARISON:  05/23/2016 . FINDINGS: Left chest tube in stable position. Miniscule left apical pneumothorax, improved from prior exam . Lungs are clear. No pleural effusion. Heart size normal. No acute bony abnormality. IMPRESSION: Left chest tube in stable position. Miniscule left apical pneumothorax, improved from prior exam . Electronically Signed   By: [place]   On: 05/24/2016 09:01   Dg Chest Port 1 View  Result Date: 05/23/2016 CLINICAL DATA:  History of pneumothorax, followup EXAM: PORTABLE CHEST 1 VIEW COMPARISON:  Chest x-ray of 05/23/2016 FINDINGS: There is only a very small left apical pneumothorax remaining, slightly diminished compared to the most recent chest x-ray. Left chest tube remains. Right lung is clear. Heart size is stable. IMPRESSION: Further decrease in tiny left apical pneumothorax. Left chest tube remains. Electronically Signed   By: Ivar Drape M.D.   On: 05/23/2016 12:44   Dg Chest Port 1 View  Result Date: 05/23/2016 CLINICAL DATA:  Pneumothorax. EXAM: PORTABLE CHEST 1 VIEW COMPARISON:  05/22/2016 .  12/29/2015.  PET-CT 01/23/2016.  FINDINGS: Mediastinum and hilar  structures are normal. Heart size stable. Left chest tube in stable position. Stable tiny left apical pneumothorax. Persistent opacity in the left apex. Stable biapical pleural thickening. IMPRESSION: 1. Left chest tube in stable position. Stable tiny left apical pneumothorax. 2. Stable tiny left apical pneumothorax. Electronically Signed   By: Marcello Moores  Register   On: 05/23/2016 06:36   Dg Chest Port 1 View  Result Date: 05/22/2016 CLINICAL DATA:  Left-sided chest tube, history of squamous cell carcinoma of the left lung, also history of substance abuse EXAM: PORTABLE CHEST 1 VIEW COMPARISON:  Chest x-ray of 05/22/2016 FINDINGS: After insertion of the left chest tube only a tiny left apical pneumothorax is present of less than 5%. Chronic changes remain bilaterally. Asymmetric opacity in the left lung apex also stable. IMPRESSION: Only a tiny left apical pneumothorax remains after insertion of left chest tube. Electronically Signed   By: Ivar Drape M.D.   On: 05/22/2016 13:35   Assessment: Pneumothorax, L, spontaneous, in a patient with at least moderate-severe COPD based on chest CT scan LUL NSCLCA, st III, on homeopathic treatment COPD, not in exacerbation   Plan: Agree with discontinuing Chest tube this am.  Agree with CXR this pm and CXR in am. If CXR is stable,  anticipate pt can be discharged. If PTX is bigger, will need a chest tube placed again by IR.  Pt wants to go home today to see his visiting grandchildren.  I convinced him to stay until tomorrow am.  Plan to get CXR at 5am tomorrow >> if CXR is stable, he can go home after that.  Avoid straining.  F/U with PCP and homeopahtic provider for his Lung CA.  Alb MDI prn for SOB.   PCCM will sign off for now.  Call back if with issues or if with recurrence of PTX,    Family :No family at bedside. Patient updated of plans at bedside.    Monica Becton, MD 05/24/2016, 10:45 AM [facility] Pager [phone]  [phone] After 3 pm or if no answer, call [phone]

## 2016-05-24 NOTE — Progress Notes (Signed)
PROGRESS NOTE    Miguel Orr  SVX:[id number] DOB: 07-12-1940 DOA: 05/22/2016 PCP: [name], MD  Outpatient Specialists:   Oncology Dr. Earlie Server Radiation oncology Dr Sondra Come   Brief Narrative:  76 y/o ? 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 Pulm considering water-seal and discussion about d/c tube am. Chest x-ray repeated 05/23/2016 seems stable-- for pain continue OxyIR 5 mg every 4 prn.  Rpt cxr in am--if stable d/c home stage III squamous cell CA -on herbal supplements DNAR now after discussion with palliative -need Hospice to follow as OP--moving to [place] soon Mild prerenal acidemia-BUN/creatinine slightly up from admission 22/0.9-continue  saline 50 cc per hour--resolved.  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 of hyperthyroid state -continue Synthroid 200 mcg daily-TSH 0.030--would adjust when more clinically stable and downard titrate as OP Bipolar-continue Xanax 0.25   DVT prophylaxis: Lovenox Code Status: DNAR Family Communication: no family + Disposition Plan: unclear-await chest tube removal and discussion prior to decisions   Consultants:   Pulmonology  Procedures:   Pigtail catheter placement 05/22/2016 in the emergency room  Antimicrobials:   None currently    Subjective:  Fair Pain manageable Wants to go home Long discussion  Objective: Vitals:   05/23/16 1314 05/23/16 2144 05/24/16 0613 05/24/16 1304  BP: (!) 130/58 132/64 130/66 (!) 154/45  Pulse: 66 65 (!) 56 (!) 52  Resp:  '17 18 16  '$ Temp: 97.9 F (36.6 C) 97.6 F (36.4 C) 97.5 F (36.4 C) 97.7 F (36.5 C)  TempSrc: Oral Oral Oral Oral  SpO2: 94% 97% 97% 98%  Weight:      Height:        Intake/Output Summary (Last 24 hours) at 05/24/16 1342 Last data filed at 05/24/16 1249  Gross per 24 hour  Intake          1570.02 ml  Output             1375 ml  Net           195.02 ml   Filed Weights   05/22/16 1111 05/22/16 1545  Weight: 45.4 kg (100 lb) 43.5 kg (96 lb)    Examination:  General exam: Appears calm and comfortable  Respiratory system: Clear to auscultation. Respiratory  effort normal. No added sound Cardiovascular system: S1 & S2 heard, RRR. No JVD, murmurs, rubs, gallops or clicks. No pedal edema. Gastrointestinal system: Abdomen is nondistended, soft Central nervous system: Alert and oriented. No focal neurological deficits. Extremities: Symmetric 5 x 5 power. Skin: No rashes, lesions or ulcers Psychiatry: Judgement and insight appear normal. Mood & affect appropriate.     Data Reviewed: I have  personally reviewed following labs and imaging studies  CBC:  Recent Labs Lab 05/22/16 1155 05/23/16 0522 05/24/16 0603  WBC 7.6 5.5 5.6  NEUTROABS 5.7  --   --   HGB 13.4 11.6* 11.8*  HCT 40.1 35.4* 35.0*  MCV 92.8 93.2 92.1  PLT 144* 131* 034*   Basic Metabolic Panel:  Recent Labs Lab 05/22/16 1155 05/23/16 0522 05/24/16 0603  NA 136 137 137  K 4.2 4.0 3.9  CL 101 104 106  CO2 '27 29 26  '$ GLUCOSE 111* 103* 108*  BUN 19 22* 14  CREATININE 0.90 0.95 0.73  CALCIUM 9.4 8.8* 9.0   GFR: Estimated Creatinine Clearance: 50.6 mL/min (by C-G formula based on SCr of 0.73 mg/dL). Liver Function Tests:  Recent Labs Lab 05/22/16 1155 05/23/16 0522 05/24/16 0603  AST '22 18 19  '$ ALT 13* 10* 11*  ALKPHOS 63 58 54  BILITOT 0.5 0.5 0.4  PROT 7.4 6.2* 6.5  ALBUMIN 3.8 3.2* 3.2*   No results for input(s): LIPASE, AMYLASE in the last 168 hours. No results for input(s): AMMONIA in the last 168 hours. Coagulation Profile: No results for input(s): INR, PROTIME in the last 168 hours. Cardiac Enzymes: No results for input(s): CKTOTAL, CKMB, CKMBINDEX, TROPONINI in the last 168 hours. BNP (last 3 results) No results for input(s): PROBNP in the last 8760 hours. HbA1C: No results for input(s): HGBA1C in the last 72 hours. CBG: No results for input(s): GLUCAP in the last 168 hours. Lipid Profile: No results for input(s): CHOL, HDL, LDLCALC, TRIG, CHOLHDL, LDLDIRECT in the last 72 hours. Thyroid Function Tests:  Recent Labs  05/22/16 1155  TSH 0.030*   Anemia Panel: No results for input(s): VITAMINB12, FOLATE, FERRITIN, TIBC, IRON, RETICCTPCT in the last 72 hours. Urine analysis:    Component Value Date/Time   COLORURINE YELLOW 05/22/2016 2133   APPEARANCEUR CLEAR 05/22/2016 2133   LABSPEC 1.027 05/22/2016 2133   PHURINE 5.0 05/22/2016 2133   GLUCOSEU NEGATIVE 05/22/2016 2133   HGBUR NEGATIVE 05/22/2016 2133   BILIRUBINUR NEGATIVE 05/22/2016 2133   KETONESUR NEGATIVE  05/22/2016 2133   PROTEINUR NEGATIVE 05/22/2016 2133   NITRITE NEGATIVE 05/22/2016 2133   LEUKOCYTESUR NEGATIVE 05/22/2016 2133   Sepsis Labs: '@LABRCNTIP'$ (procalcitonin:4,lacticidven:4)  )No results found for this or any previous visit (from the past 240 hour(s)).       Radiology Studies: Dg Chest Port 1 View  Result Date: 05/24/2016 CLINICAL DATA:  Chest tube. EXAM: PORTABLE CHEST 1 VIEW COMPARISON:  05/23/2016 . FINDINGS: Left chest tube in stable position. Miniscule left apical pneumothorax, improved from prior exam . Lungs are clear. No pleural effusion. Heart size normal. No acute bony abnormality. IMPRESSION: Left chest tube in stable position. Miniscule left apical pneumothorax, improved from prior exam . Electronically Signed   By: [place]   On: 05/24/2016 09:01   Dg Chest Port 1 View  Result Date: 05/23/2016 CLINICAL DATA:  History of pneumothorax, followup EXAM: PORTABLE CHEST 1 VIEW COMPARISON:  Chest x-ray of 05/23/2016 FINDINGS: There is only a very small left apical pneumothorax remaining, slightly diminished compared to  the most recent chest x-ray. Left chest tube remains. Right lung is clear. Heart size is stable. IMPRESSION: Further decrease in tiny left apical pneumothorax. Left chest tube remains. Electronically Signed   By: Ivar Drape M.D.   On: 05/23/2016 12:44   Dg Chest Port 1 View  Result Date: 05/23/2016 CLINICAL DATA:  Pneumothorax. EXAM: PORTABLE CHEST 1 VIEW COMPARISON:  05/22/2016 .  12/29/2015.  PET-CT 01/23/2016. FINDINGS: Mediastinum and hilar structures are normal. Heart size stable. Left chest tube in stable position. Stable tiny left apical pneumothorax. Persistent opacity in the left apex. Stable biapical pleural thickening. IMPRESSION: 1. Left chest tube in stable position. Stable tiny left apical pneumothorax. 2. Stable tiny left apical pneumothorax. Electronically Signed   By: Marcello Moores  Register   On: 05/23/2016 06:36        Scheduled  Meds: . dexamethasone  4 mg Oral Daily   Followed by  . [START ON 05/26/2016] dexamethasone  2 mg Oral Daily   Followed by  . [START ON 06/02/2016] dexamethasone  1 mg Oral Daily  . enoxaparin (LOVENOX) injection  30 mg Subcutaneous Q24H  . feeding supplement (ENSURE ENLIVE)  237 mL Oral Q24H  . levothyroxine  200 mcg Oral QAC breakfast  . multivitamin with minerals  1 tablet Oral Daily   Continuous Infusions: . sodium chloride 50 mL/hr at 05/24/16 1249     LOS: 1 day    Time spent: [place], MD [facility] ([phone]   If 7PM-7AM, please contact night-coverage [email or web address] Password [email or web address] 05/24/2016, 1:42 PM

## 2016-05-25 ENCOUNTER — Inpatient Hospital Stay (HOSPITAL_COMMUNITY): Payer: Medicare Other

## 2016-05-25 NOTE — Progress Notes (Signed)
Patient discharged home with wife, discharge instructions given and explained to patient/wife and they verbalized understanding, denies any pain/distress, no wound noted, chest tube site clean/dry/intact. Accompanied home by wife/son. Transported to the car by staff.

## 2016-05-25 NOTE — Discharge Summary (Signed)
Physician Discharge Summary  Miguel Orr SVX:[id number] DOB: [birth year] DOA: 74/28/2018  PCP: Miguel Solo, MD  Admit date: 05/22/2016 Discharge date: 05/25/2016  Time spent: 35 minutes  Recommendations for Outpatient Follow-up:  1. Patient is recommended to follow-up as an outpatient with hospice services and/or physicians up in [place] where he plans to move soon. His prognosis overall is poor 2. 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  water-seal formed and Chest x-ray repeated 05/25/2016 seems stable-- for pain continue OxyIR 5 mg every 4 prn.   pulmonary was consulted and managed the tube and felt he would be okay for discharge 05/25/2016 stage III squamous cell CA -on herbal supplements DNAR now after discussion with palliative -need Hospice to follow as OP--moving to [place] soon Mild prerenal acidemia-BUN/creatinine slightly up from admission 22/0.9-continue saline 50 cc per hour--resolved.   Severe emphysema and heavy smoking history-monitor, continue levalbuterol 0.60 every 6 when necessary 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 of hyperthyroid state -continue Synthroid 200 mcg daily-TSH 0.030--would adjust when more clinically stable and downard titrate as OP Bipolar-continue Xanax 0.25  Procedures: Pig tail cath  Consultations:  Pulmonary  Pallaitive  Discharge Exam: Vitals:   05/24/16 1304 05/25/16 0504  BP: (!) 154/45 (!) 148/63  Pulse: (!) 52 (!) 58  Resp: 16 18  Temp: 97.7 F (36.5 C) 98.1 F (36.7 C)   Fair , mild CP at sitre No n/v  General: eomim ncat Cardiovascular: s1 s 2no m/r/g Respiratory: clear no added sound  Discharge Instructions    Current Discharge Medication List    CONTINUE these medications which have NOT CHANGED   Details  alendronate (FOSAMAX) 70 MG tablet Take 70 mg by mouth every Monday. Take with a full glass of water on an empty stomach.     ALPRAZolam (XANAX) 0.25 MG tablet Take 0.25 mg by mouth at bedtime as needed for anxiety or sleep.  Ginkgo Biloba 60 MG CAPS Take 180 mg by mouth 2 (two) times daily.     HYDROcodone-acetaminophen (NORCO) 10-325 MG tablet Take 0.5-1 tablets by mouth 3 (three) times daily as needed for moderate pain.     levothyroxine (SYNTHROID, LEVOTHROID) 200 MCG tablet Take 200 mcg by mouth daily before breakfast.    OVER  THE COUNTER MEDICATION Take 20 drops by mouth 2 (two) times daily. CBD oil      STOP taking these medications     aspirin EC 325 MG tablet        Allergies  Allergen Reactions  . Dilaudid [Hydromorphone Hcl] Other (See Comments)    Reaction: Hallucinations       The results of significant diagnostics from this hospitalization (including imaging, microbiology, ancillary and laboratory) are listed below for reference.    Significant Diagnostic Studies: Dg Chest 2 View  Result Date: 05/22/2016 CLINICAL DATA:  Worsening shortness breath over the past 2 days. Known lung malignancy. No known trauma. EXAM: CHEST  2 VIEW COMPARISON:  PA and lateral chest x-ray dated December 29, 2015 FINDINGS: There is a new approximately 50% left-sided pneumothorax. There is no mediastinal shift. The right lung is well-expanded. The interstitial markings of both lungs are coarse. There may be a tiny amount of pleural fluid at the left lung base. The heart is small. The pulmonary vascularity is not engorged. There is wedge compression of T8 and T9 and T10 and T12 which is been previously demonstrated. IMPRESSION: New 50% left-sided pneumothorax. Underlying COPD and pulmonary fibrotic changes. These results were called by telephone at the time of interpretation on 05/22/2016 at 11:43 am to Dr. Waynetta Pean , who verbally acknowledged these results. Electronically Signed   By: David  Martinique M.D.   On: 05/22/2016 11:44   Dg Chest Port 1 View  Result Date: 05/25/2016 CLINICAL DATA:  Pneumothorax. EXAM: PORTABLE CHEST 1 VIEW COMPARISON:  Chest radiograph 05/24/2016 FINDINGS: There is a trace left apical pneumothorax, unchanged. Cardiomediastinal contours are normal. Faint basilar predominant opacities are unchanged. No new consolidation or pulmonary edema. No pleural effusion. IMPRESSION: Unchanged trace left apical pneumothorax. Electronically Signed   By: Ulyses Jarred M.D.   On: 05/25/2016 05:03   Dg Chest Port 1  View  Result Date: 05/24/2016 CLINICAL DATA:  Left pneumothorax. EXAM: PORTABLE CHEST 1 VIEW COMPARISON:  Radiographs of May 24, 2016. FINDINGS: The heart size and mediastinal contours are within normal limits. Minimal left apical pneumothorax is noted which is unchanged. Right lung is clear. No significant pleural effusion is noted. The visualized skeletal structures are unremarkable. IMPRESSION: Minimal left apical pneumothorax which is unchanged. Electronically Signed   By: Marijo Conception, M.D.   On: 05/24/2016 15:18   Dg Chest Port 1 View  Result Date: 05/24/2016 CLINICAL DATA:  Chest tube. EXAM: PORTABLE CHEST 1 VIEW COMPARISON:  05/23/2016 . FINDINGS: Left chest tube in stable position. Miniscule left apical pneumothorax, improved from prior exam . Lungs are clear. No pleural effusion. Heart size normal. No acute bony abnormality. IMPRESSION: Left chest tube in stable position. Miniscule left apical pneumothorax, improved from prior exam . Electronically Signed   By: [place]   On: 05/24/2016 09:01   Dg Chest Port 1 View  Result Date: 05/23/2016 CLINICAL DATA:  History of pneumothorax, followup EXAM: PORTABLE CHEST 1 VIEW COMPARISON:  Chest x-ray of 05/23/2016 FINDINGS: There is only a very small left apical pneumothorax remaining, slightly diminished compared to the most recent chest x-ray.  Left chest tube remains. Right lung is clear. Heart size is stable. IMPRESSION: Further decrease in tiny left apical pneumothorax. Left chest tube remains. Electronically Signed   By: Ivar Drape M.D.   On: 05/23/2016 12:44   Dg Chest Port 1 View  Result Date: 05/23/2016 CLINICAL DATA:  Pneumothorax. EXAM: PORTABLE CHEST 1 VIEW COMPARISON:  05/22/2016 .  12/29/2015.  PET-CT 01/23/2016. FINDINGS: Mediastinum and hilar structures are normal. Heart size stable. Left chest tube in stable position. Stable tiny left apical pneumothorax. Persistent opacity in the left apex. Stable biapical pleural thickening.  IMPRESSION: 1. Left chest tube in stable position. Stable tiny left apical pneumothorax. 2. Stable tiny left apical pneumothorax. Electronically Signed   By: Marcello Moores  Register   On: 05/23/2016 06:36   Dg Chest Port 1 View  Result Date: 05/22/2016 CLINICAL DATA:  Left-sided chest tube, history of squamous cell carcinoma of the left lung, also history of substance abuse EXAM: PORTABLE CHEST 1 VIEW COMPARISON:  Chest x-ray of 05/22/2016 FINDINGS: After insertion of the left chest tube only a tiny left apical pneumothorax is present of less than 5%. Chronic changes remain bilaterally. Asymmetric opacity in the left lung apex also stable. IMPRESSION: Only a tiny left apical pneumothorax remains after insertion of left chest tube. Electronically Signed   By: Ivar Drape M.D.   On: 05/22/2016 13:35    Microbiology: No results found for this or any previous visit (from the past 240 hour(s)).   Labs: Basic Metabolic Panel:  Recent Labs Lab 05/22/16 1155 05/23/16 0522 05/24/16 0603  NA 136 137 137  K 4.2 4.0 3.9  CL 101 104 106  CO2 '27 29 26  '$ GLUCOSE 111* 103* 108*  BUN 19 22* 14  CREATININE 0.90 0.95 0.73  CALCIUM 9.4 8.8* 9.0   Liver Function Tests:  Recent Labs Lab 05/22/16 1155 05/23/16 0522 05/24/16 0603  AST '22 18 19  '$ ALT 13* 10* 11*  ALKPHOS 63 58 54  BILITOT 0.5 0.5 0.4  PROT 7.4 6.2* 6.5  ALBUMIN 3.8 3.2* 3.2*   No results for input(s): LIPASE, AMYLASE in the last 168 hours. No results for input(s): AMMONIA in the last 168 hours. CBC:  Recent Labs Lab 05/22/16 1155 05/23/16 0522 05/24/16 0603  WBC 7.6 5.5 5.6  NEUTROABS 5.7  --   --   HGB 13.4 11.6* 11.8*  HCT 40.1 35.4* 35.0*  MCV 92.8 93.2 92.1  PLT 144* 131* 134*   Cardiac Enzymes: No results for input(s): CKTOTAL, CKMB, CKMBINDEX, TROPONINI in the last 168 hours. BNP: BNP (last 3 results) No results for input(s): BNP in the last 8760 hours.  ProBNP (last 3 results) No results for input(s): PROBNP in  the last 8760 hours.  CBG: No results for input(s): GLUCAP in the last 168 hours.     SignedNita Sells MD   [facility] 05/25/2016, 8:36 AM

## 2016-05-31 ENCOUNTER — Emergency Department (HOSPITAL_COMMUNITY)
Admission: EM | Admit: 2016-05-31 | Discharge: 2016-05-31 | Discharge status: Against Medical Advice | Payer: Medicare Other | Attending: Emergency Medicine | Admitting: Emergency Medicine

## 2016-05-31 ENCOUNTER — Emergency Department (HOSPITAL_COMMUNITY): Payer: Medicare Other

## 2016-05-31 ENCOUNTER — Encounter (HOSPITAL_COMMUNITY): Payer: Self-pay | Admitting: Emergency Medicine

## 2016-05-31 DIAGNOSIS — J948 Other specified pleural conditions: Secondary | ICD-10-CM | POA: Insufficient documentation

## 2016-05-31 DIAGNOSIS — F17210 Nicotine dependence, cigarettes, uncomplicated: Secondary | ICD-10-CM | POA: Diagnosis not present

## 2016-05-31 DIAGNOSIS — Z79899 Other long term (current) drug therapy: Secondary | ICD-10-CM | POA: Diagnosis not present

## 2016-05-31 DIAGNOSIS — R0602 Shortness of breath: Secondary | ICD-10-CM | POA: Diagnosis present

## 2016-05-31 NOTE — ED Notes (Signed)
Bed: WLPT1 Expected date:  Expected time:  Means of arrival:  Comments: 

## 2016-05-31 NOTE — ED Triage Notes (Signed)
Pt being sent from [facility] for left sided pneumothorax. Pt is stable at this time.

## 2016-05-31 NOTE — ED Triage Notes (Signed)
Patient sent from PCP where had "xray showing that having left sided lung collapse again".  Patient having back pain at this time.

## 2016-05-31 NOTE — ED Provider Notes (Signed)
Medical screening examination/treatment/procedure(s) were conducted as a shared visit with non-physician practitioner(s) and myself.  I personally evaluated the patient during the encounter.  76 YO W/ h/o SCC of lul here with recurent pneumothorax after being discharged for same 6 days ago after having had pigtail for 2 days.  On exam he is comfortable with mild tachypnea and hypoxia. Decreased lung sounds on left. No other abnormalities.  Patient wants to go home if possible. It appears this PTX is unchanged from Tuesday (per radiology report). Plan to walk with pulse ox and document. Then speak with pulmonolgy to determine if needs another chest tube and disposition appropriately.  Plan to walk, but patient refused. Stated he had oxygen at home to use and was waiting hospice/palliative care consult. He also stated he wanted to go home. He didn't want to endure a chest tube since he was basically asymptomatic. We tried to talk to him about doing anxiolysis or sedation but he didn't want it. We also discussed that this could quickly become a life threatening problem and he stated understanding. Tried to get a hold of pulmonology to come up with an outpatient follow up plan however patient wanted to leave.  He was alert, oriented and competent to make the decision. His wife was here and was in agreement with patient. He stated to the PA he would come back if things got worse or he changed his mind. He was discharged against medical advice.     Merrily Pew, MD 06/01/16 1137

## 2016-05-31 NOTE — ED Notes (Addendum)
Pt decided he did not want to wait on discharge instructions or get vital signs taken.  Stated he was tired of waiting to be discharged.  Signed out.

## 2016-05-31 NOTE — ED Provider Notes (Signed)
[facility] DEPT Provider Note   CSN: [id number] Arrival date & time: 05/31/16  1647     History   Chief Complaint Chief Complaint  Patient presents with  . sent for lung collapsed    HPI Miguel Orr is a 76 y.o. male with history of COPD, squamous cell lung cancer of left lung, recently seen here in the ED on 05/22/16 for left-sided spontaneous pneumothorax presents today sent from [facility] for left-sided pneumothorax seen on x-ray during PCP visit today. Patient reports hx of back pain that makes his pain worse. He admits to associated shortness of breath when he moves a lot and when he is on his feet. He reports associated chest pain at lower left side on ribs. He states that lying down "its not so bad."  He states he feel similar to his previous ED visit. Pt denies fever, chills, nausea, vomiting. He denies numbness or tingling. He denies trouble breathing or swallowing. He denies trying anything for his symptoms. He states he simply came straight here from his PCP. Reports oxygen use at home.    The history is provided by the patient. No language interpreter was used.    Past Medical History:  Diagnosis Date  . Anxiety   . Chronic back pain   . Chronic generalized abdominal pain   . Chronic pancreatitis ([place])   . Depression   . Hearing loss    HAS HEARING AIDES  . History of vertebral compression fracture    T8, 10, 11, 12  . 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 Mass of upper lobe of left lung    CXR 12/29/15, CT CHEST 1019/17, PET 01/23/16  . Osteoporosis   . Stage III squamous cell carcinoma of left lung ([place]) 02/24/2016  . Substance abuse    TOBACCO    Patient Active Problem List   Diagnosis Date Noted  . Protein-calorie malnutrition, severe 05/24/2016  . Loss of weight   . Palliative care encounter   . DNR (do not resuscitate)   . Goals of care, counseling/discussion   . Pneumothorax on left 05/22/2016  . Stage III squamous cell carcinoma of left lung ([place])  02/24/2016  . Encounter for antineoplastic chemotherapy 02/24/2016  . Osteoporosis   . Thyroid disease   . Chronic pancreatitis ([place])   . Chronic back pain   . Chronic generalized abdominal pain   . Hearing loss   . Anxiety   . Depression   . History of vertebral compression fracture   . Substance abuse   . Mass of upper lobe of left lung     Past Surgical History:  Procedure Laterality Date  . APPENDECTOMY    . BOWEL RESECTION  1993  . EYE SURGERY Bilateral 05/2011   DR. SHAPIRO  . MESENTERIC ARTERY BYPASS  1993  . RECTAL ABSCESS         Home Medications    Prior to Admission medications   Medication Sig Start Date End Date Taking? Authorizing Provider  alendronate (FOSAMAX) 70 MG tablet Take 70 mg by mouth every Monday. Take with a full glass of water on an empty stomach.    Yes Historical Provider, MD  ALPRAZolam Duanne Moron) 0.25 MG tablet Take 0.25 mg by mouth at bedtime as needed for anxiety or sleep.    Yes Historical Provider, MD  DULoxetine (CYMBALTA) 30 MG capsule Take 30 mg by mouth 2 (two) times daily. 05/28/16  Yes Historical Provider, MD  HYDROcodone-acetaminophen (NORCO) 10-325 MG tablet Take  0.5-1 tablets by mouth 3 (three) times daily as needed for moderate pain.    Yes Historical Provider, MD  levothyroxine (SYNTHROID, LEVOTHROID) 150 MCG tablet Take 150 mcg by mouth daily. 05/28/16  Yes Historical Provider, MD    Family History Family History  Problem Relation Age of Onset  . Hyperlipidemia Mother   . Pneumonia Father   . Alzheimer's disease Sister   . Pneumonia Brother   . Cancer Brother     BONE MARROW  . Hypertension Brother     Social History Social History  Substance Use Topics  . Smoking status: Current Every Day Smoker    Packs/day: 1.00    Years: 60.00    Types: Cigarettes  . Smokeless tobacco: Never Used  . Alcohol use No     Allergies   Dilaudid [hydromorphone hcl]   Review of Systems Review of Systems  Constitutional: Negative  for fever.  Respiratory: Positive for shortness of breath. Negative for cough.   Cardiovascular: Positive for chest pain.  Gastrointestinal: Negative for abdominal pain, nausea and vomiting.  Neurological: Negative for numbness.  All other systems reviewed and are negative.    Physical Exam Updated Vital Signs BP 124/63 (BP Location: Left Arm)   Pulse 71   Temp 97.6 F (36.4 C) (Oral)   Resp 22   SpO2 90%   Physical Exam  Constitutional: He is oriented to person, place, and time. He appears well-developed and well-nourished. No distress.  Well appearing  HENT:  Head: Normocephalic and atraumatic.  Nose: Nose normal.  Mouth/Throat: Oropharynx is clear and moist.  Eyes: Conjunctivae and EOM are normal.  Neck: Normal range of motion.  Cardiovascular: Normal rate, normal heart sounds and intact distal pulses.   Pulmonary/Chest: Effort normal. No respiratory distress. He has wheezes.  Normal work of breathing. No respiratory distress noted. No accessory muscle usage. Decreased sounds on left side.    Abdominal: Soft. There is no tenderness. There is no rebound and no guarding.  Soft and nontender.   Musculoskeletal: Normal range of motion. He exhibits tenderness.  Exhibits tender to left lower lateral ribs near area of previous chest tube. Previous incision from chest tube appears healing well with no surrounding erythema, no discharge. Does not appear infectious.   Neurological: He is alert and oriented to person, place, and time.  Skin: Skin is warm.  Psychiatric: He has a normal mood and affect. His behavior is normal.  Nursing note and vitals reviewed.    ED Treatments / Results  Labs (all labs ordered are listed, but only abnormal results are displayed) Labs Reviewed - No data to display  EKG  EKG Interpretation None       Radiology Dg Chest 2 View  Result Date: 05/31/2016 CLINICAL DATA:  Known left-sided pneumothorax, left-sided chest pain EXAM: CHEST  2 VIEW  COMPARISON:  05/28/2016, 05/31/2016 and 05/25/2016 CXR FINDINGS: The heart size and mediastinal contours are within normal limits. Stable left hydropneumothorax since recent comparisons approaching 50%. No mediastinal shift. Right lung remains clear. Chronic mid thoracic vertebral body compressions contributing to chronic stable kyphosis. IMPRESSION: Stable left hydropneumothorax approaching 50% unchanged in appearance from recent exams dating back through 05/28/2016. Electronically Signed   By: Ashley Royalty M.D.   On: 05/31/2016 17:23    Procedures Procedures (including critical care time)  Medications Ordered in ED Medications - No data to display   Initial Impression / Assessment and Plan / ED Course  I have reviewed the triage vital signs  and the nursing notes.  Pertinent labs & imaging results that were available during my care of the patient were reviewed by me and considered in my medical decision making (see chart for details).    I have seen and evaluated Julieta Gutting. He is currently awake, alert, and oriented and has requested to leave the emergency department against medial advice. I have explained that we have not completed evaluation and treatment and that the risks of leaving may include the worsening of his condition, additional pain or disability, the need for further and more invasive medical care, and death, risks that we are unable to predict at this time due to the incomplete evaltuation. I discussed the above with the patient who has the capacity to understand and understood the discussion and is, without force or coercion, still requesting to leave. Patient was made aware that he can return to the ED at any time without prejudice. The patient was given warning, medications were prescribed as needed, and follow-up was given when appropriate. Patient was further advised to seek follow-up care as directed or with their own personal provider, or the provider of their choosing.    Patient here sent from PCP for hydropneumothorax visualized on x-ray. X-ray done here showed no change from x-ray done 3 days ago. Patient is stable here with normal vital signs, afebrile, and in no pain or apparent distress. He is in no respiratory distress. Heart sounds are clear. Patient does appear to have some wheezing on the right side and decreased lung sounds on the left side consistent with his pneumothorax. Abdomen was soft and nontender. He did have slight tenderness to the left lower rib cage near the site of his previous chest tube insertion. That area does not appear to be infectious.  Patient does not want to get the chest tube done. He states that "he feels fine" and he just has back pain. I instructed patient that I still need to speak with pulmonology to see what they think would be the best course of action at this time. Whether to still have chest tube done here or outpatient with pulmonology. Patient did not want to stay any longer and decided to leave. Patient also seen and evaluated by Dr. Dayna Barker.   Final Clinical Impressions(s) / ED Diagnoses   Final diagnoses:  Hydropneumothorax    New Prescriptions Discharge Medication List as of 05/31/2016  7:53 PM       [address], [place] 05/31/16 2006    Merrily Pew, MD 06/01/16 [phone]

## 2016-12-23 DEATH — deceased

## 2017-03-28 IMAGING — PT NM PET TUM IMG INITIAL (PI) SKULL BASE T - THIGH
1 of 7 series · 2 of 25 positions shown · non-contrast
Comparison: Chest CT on 01/11/2016

CLINICAL DATA: Initial treatment strategy for left upper lobe lung
mass.

EXAM:
NUCLEAR MEDICINE PET SKULL BASE TO THIGH
TECHNIQUE: 6.3 mCi F-18 FDG was injected intravenously. Full-ring PET imaging
was performed from the skull base to thigh after the radiotracer. CT
data was obtained and used for attenuation correction and anatomic
localization.
FASTING BLOOD GLUCOSE:  Value: 88 mg/dl

[Series 4: ct sk_thigh 5.0 b31f · axial · 5.0mm · 0.98mm/px · z∈[-1068,-860]mm · 2 of 207 slices shown]
[im 52/207  brain]
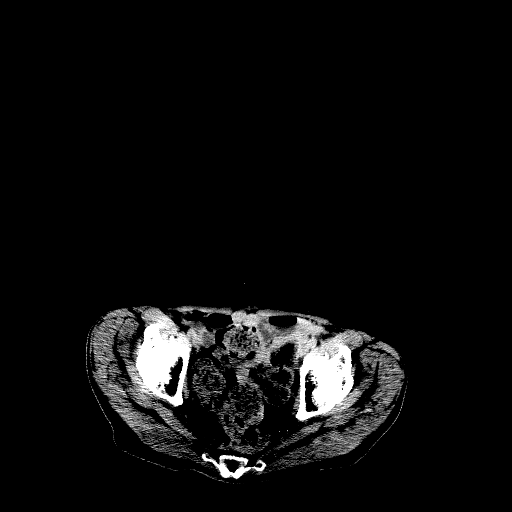
[im 104/207  brain]
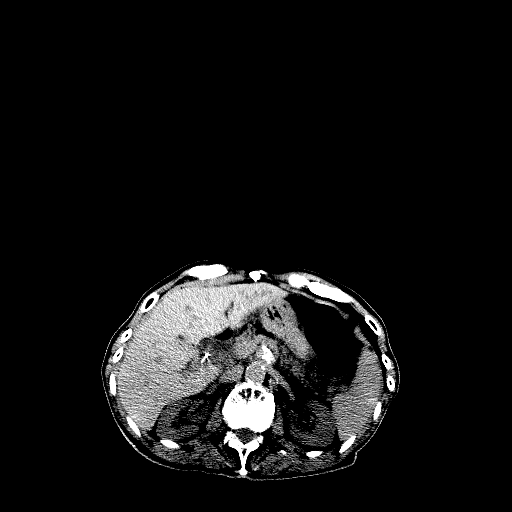

[2 of 25 positions shown; findings below may reference images not displayed]

FINDINGS: NECK

No hypermetabolic lymph nodes in the neck.

CHEST

4.3 cm spiculated mass in the peripheral left upper lobe which abuts
the left anterior chest wall is hypermetabolic, with SUV max of
22.5. No other suspicious pulmonary nodules or masses seen on CT
images. Moderate to severe centrilobular emphysema again noted. No
evidence of pleural effusion.

11 mm subcarinal mediastinal lymph node is seen with SUV max of 4.3.
No other hypermetabolic mediastinal or hilar lymph nodes identified.

ABDOMEN/PELVIS

No abnormal hypermetabolic activity within the liver, pancreas,
adrenal glands, or spleen. No hypermetabolic lymph nodes in the
abdomen or pelvis.

Prior cholecystectomy noted. Aortic atherosclerosis. Surgical clips
noted within the pelvis.

SKELETON

No focal hypermetabolic activity to suggest skeletal metastasis.
IMPRESSION: Hypermetabolic 4.3 cm peripheral left upper lobe mass which abuts
the left anterior chest wall, consistent with primary bronchogenic
carcinoma.

Mildly hypermetabolic 11 mm subcarinal mediastinal lymph node. Lymph
node metastasis cannot be excluded.

No evidence of metastatic disease within the neck, abdomen, or
pelvis.

Moderate to severe centrilobular emphysema.
